# Patient Record
Sex: Male | Born: 1945 | Race: Black or African American | Hispanic: No | Smoking: Never smoker
Health system: Southern US, Community
[De-identification: ages and names within clinical notes are randomized; demographics above are authoritative.]

## PROBLEM LIST (undated history)

## (undated) DIAGNOSIS — R188 Other ascites: Secondary | ICD-10-CM

## (undated) DIAGNOSIS — R160 Hepatomegaly, not elsewhere classified: Secondary | ICD-10-CM

## (undated) DIAGNOSIS — K7460 Unspecified cirrhosis of liver: Secondary | ICD-10-CM

## (undated) DIAGNOSIS — I2510 Atherosclerotic heart disease of native coronary artery without angina pectoris: Secondary | ICD-10-CM

## (undated) DIAGNOSIS — N189 Chronic kidney disease, unspecified: Secondary | ICD-10-CM

## (undated) DIAGNOSIS — I10 Essential (primary) hypertension: Secondary | ICD-10-CM

## (undated) DIAGNOSIS — N529 Male erectile dysfunction, unspecified: Secondary | ICD-10-CM

## (undated) DIAGNOSIS — K635 Polyp of colon: Secondary | ICD-10-CM

## (undated) DIAGNOSIS — K219 Gastro-esophageal reflux disease without esophagitis: Secondary | ICD-10-CM

## (undated) HISTORY — PX: PARACENTESIS: SHX844

## (undated) HISTORY — DX: Chronic kidney disease, unspecified: N18.9

## (undated) HISTORY — DX: Male erectile dysfunction, unspecified: N52.9

## (undated) HISTORY — DX: Polyp of colon: K63.5

## (undated) HISTORY — DX: Unspecified cirrhosis of liver: K74.60

## (undated) HISTORY — DX: Essential (primary) hypertension: I10

## (undated) HISTORY — DX: Atherosclerotic heart disease of native coronary artery without angina pectoris: I25.10

---

## 2000-02-25 HISTORY — PX: CORONARY ANGIOPLASTY WITH STENT PLACEMENT: SHX49

## 2000-02-27 ENCOUNTER — Inpatient Hospital Stay (HOSPITAL_COMMUNITY): Admission: AD | Admit: 2000-02-27 | Discharge: 2000-02-29 | Payer: Self-pay | Admitting: Cardiology

## 2007-10-26 HISTORY — PX: COLONOSCOPY: SHX174

## 2007-11-12 ENCOUNTER — Ambulatory Visit (HOSPITAL_COMMUNITY): Admission: RE | Admit: 2007-11-12 | Discharge: 2007-11-12 | Payer: Self-pay | Admitting: Gastroenterology

## 2007-11-12 ENCOUNTER — Encounter (INDEPENDENT_AMBULATORY_CARE_PROVIDER_SITE_OTHER): Payer: Self-pay | Admitting: Gastroenterology

## 2009-12-13 ENCOUNTER — Ambulatory Visit: Payer: Self-pay | Admitting: Oncology

## 2010-08-09 NOTE — Op Note (Signed)
NAME:  Michael Bowers, Michael Bowers               ACCOUNT NO.:  [id number]   MEDICAL RECORD NO.:  [id number]          PATIENT TYPE:  AMB   LOCATION:  ENDO                         FACILITY:  [facility]   PHYSICIAN:  John C. Madilyn Fireman, M.D.    DATE OF BIRTH:  10-07-1945   DATE OF PROCEDURE:  11/12/2007  DATE OF DISCHARGE:                               OPERATIVE REPORT   INDICATIONS FOR PROCEDURE:  Heme-positive stools in a 65 year old  patient with no prior colon screening.   PROCEDURE:  The patient was placed in the left lateral decubitus  position and placed on the pulse monitor with continuous low-flow oxygen  delivered by nasal cannula.  He was sedated 100 mcg of IV fentanyl and  10 mg IV Versed.  The Pentax video colonoscope was inserted into the  rectum and advanced to the cecum, confirmed by transillumination of  McBurney's point and visualization of the ileocecal valve and  appendiceal orifice.  The prep was good.  The cecum and ascending colon  appeared normal with no masses, polyps, diverticula or other mucosal  abnormalities.  In the transverse colon there was a 1.2-cm pedunculated  polyp which was removed in one piece by snare and retrieved through the  endoscope channel.  The remainder of the transverse, descending sigmoid  and rectum appeared normal with no further polyps, masses, diverticula  or other mucosal abnormalities.  The scope was then withdrawn and the  patient returned to the recovery room in stable condition.  He tolerated  the procedure well and there were no immediate complications.   IMPRESSION:  A single moderate size colon polyp of the transverse colon.   PLAN:  Await histology.           ______________________________  Everardo All Madilyn Fireman, M.D.     [facility]/MEDQ  D:  11/12/2007  T:  11/12/2007  Job:  [id number]   cc:   Ernestina Penna, M.D.  Fax: [phone]

## 2010-08-12 ENCOUNTER — Emergency Department (HOSPITAL_COMMUNITY)
Admission: EM | Admit: 2010-08-12 | Discharge: 2010-08-12 | Disposition: A | Discharge status: Home / Self Care | Payer: Medicare Other | Attending: Emergency Medicine | Admitting: Emergency Medicine

## 2010-08-12 DIAGNOSIS — K625 Hemorrhage of anus and rectum: Secondary | ICD-10-CM | POA: Insufficient documentation

## 2010-08-12 DIAGNOSIS — I10 Essential (primary) hypertension: Secondary | ICD-10-CM | POA: Insufficient documentation

## 2010-08-12 DIAGNOSIS — K644 Residual hemorrhoidal skin tags: Secondary | ICD-10-CM | POA: Insufficient documentation

## 2010-08-12 LAB — CBC
MCH: 29.9 pg (ref 26.0–34.0)
MCHC: 32.4 g/dL (ref 30.0–36.0)
Platelets: 212 10*3/uL (ref 150–400)
RBC: 4.08 MIL/uL — ABNORMAL LOW (ref 4.22–5.81)
RDW: 12.4 % (ref 11.5–15.5)

## 2010-08-12 NOTE — Cardiovascular Report (Signed)
[place]. [facility]  Patient:    Michael Bowers, Michael Bowers                        MRN: [id number] Proc. Date: 02/28/00 Adm. Date:  [id number] Attending:  Lenoria Farrier CC:         Noralyn Pick. Eden Emms, M.D. [facility]  Cath Lab  Monica Becton, M.D.   Cardiac Catheterization  PROCEDURE: 1. Left heart catheterization with coronary angiography and left    ventriculography. 2. PTCA with stent placement in the distal left circumflex.  INDICATIONS:  Mr. Spraggins is a 65 year old male who presented with progressive unstable angina.  He had on presentation an elevated troponin I level consistent with a small non-Q wave myocardial infarction.  He was enrolled in the A to Z study and has been treated with Aggrastat and Lovenox.  DESCRIPTION OF PROCEDURE:  A 6 French sheath was placed in the right femoral artery.  Standard Judkins 6 French catheters were utilized.  Contrast was Omnipaque.  Left ventriculography was performed in both the RAO and LAO projections.  There were no complications.  RESULTS:  HEMODYNAMICS:  Left ventricular pressure 146/12, aortic pressure 156/94. There was no aortic valve gradient.  LEFT VENTRICULOGRAM:  There is moderate akinesis of the inferior wall, moderate akinesis of the posterolateral wall.  Ejection fraction calculated at 55%.  Trace mitral regurgitation.  CORONARY ARTERIOGRAPHY:  (Right dominant).  Left main is normal.  Left anterior descending artery has a 40% followed by a 60% stenosis in the midvessel and a 30% stenosis in the distal vessel.  There was a large first diagonal branch which has a 20% stenosis proximally and a 60% stenosis in the midvessel.  There is a small second diagonal.  Left circumflex is a large vessel which comes off the left main at a very acute angle.  There is a 20% stenosis in the mid circumflex.  The distal circumflex has a 30% followed by a 95% stenosis.  The circumflex gives rise to a normal size  OM1, small OM2, and a large branching OM3.  The OM3 has a 30% stenosis in the proximal portion and a 70% stenosis in a small branch.  Right coronary artery has a 30% stenosis in the proximal vessel and 30% stenosis in the midvessel.  The distal right coronary artery gives rise to a large posterior descending artery and three small posterolateral branches.  IMPRESSION: 1. Left ventricular systolic function at lower limits of normal with wall    motion abnormalities as described. 2. Two vessel coronary artery disease as described with moderate, but    nonobstructive disease in the left anterior descending artery and critical    disease in the distal left circumflex.  PLAN:  Percutaneous intervention of the left circumflex.  See below.  PTCA PROCEDURE:  Following completion of the diagnostic catheterization, we proceeded directly with coronary intervention.  Aggrastat was continued throughout the procedure.  Heparin was administered per protocol.  We used a 7 [place] photoleft guiding catheter.  We initially attempted to pass a BMW wire into the left circumflex, however, due to the severe angulation and origin of the circumflex off of the left main, we could not manipulate this wire into the circumflex.  We then were able to successfully advance a Traverse wire into the circumflex and this was advanced beyond the lesion and positioned in the distal third obtuse marginal branch.  The lesion was then dilated with a 3.0  x 15 mm Maverick balloon inflated to 10 atmospheres.  We then deployed a 3.0 x 12 mm Nir stent at a deployment pressure of 12 atmospheres.  Final angiographic images revealed an excellent result with 0% residual stenosis and Timi 3 flow.  COMPLICATIONS:  None.  RESULTS:  Successful PTCA with stent placement in the distal left circumflex reducing a 95% stenosis to 0% residual with Timi 3 flow.  PLAN:  Aggrastat will be continued for an additional 18 hours.  Plavix will  be administered for four weeks. DD:  02/28/00 TD:  02/28/00 Job: [id number]

## 2010-08-12 NOTE — Discharge Summary (Signed)
[place]. [facility]  Patient:    Michael Bowers, Michael Bowers                        MRN: [id number] Adm. Date:  [id number] Disc. Date: 02/29/00 Attending:  Lenoria Farrier Dictator:   Lavella Hammock, P.A.                  Referring Physician Discharge [facility]  DATE OF BIRTH:  1945/09/05  PROCEDURES: 1. Cardiac catheterization. 2. Coronary arteriogram. 3. Left ventriculogram. 4. PTCA and stent of one vessel.  HOSPITAL COURSE:  Mr. Loadholt is a 65 year old male with no known history of coronary artery disease, who was admitted on February 27, 2000 for chest pain that was described as a left-sided chest burning with exertion and relieved by rest.  He had pain for about 20 minutes on the day of admission and came to the emergency room at [facility].  His chest pain was relieved by aspirin, as well as nitroglycerin and labetalol 20 mg IV for a blood pressure of 220/125. His troponin was increased slightly from ______ to 0.21, and it was decided that a cardiac catheterization was the best option for him.  He had a catheterization on February 28, 2000, which showed a normal left main, an LAD with a 40%, and then a 60%, and then a 30% lesion.  The first diagonal had a 20% and then a 60% lesion.  The circumflex had a 20%, and then a 30%, and then a 95% lesion.  There was a distal 30% lesion, and a 70% lesion in the OM-3.  He had PTCA and stent of the circumflex, reducing the stenosis from 95% to 0 with TIMI 3 flow.  His EF was 55% with moderate inferior akinesis and moderate posterolateral akinesis.  He tolerated the procedure well and the sheath was removed without difficulty.  The next day, he was pain-free and had no problems with his groin.  He is being seen by the [facility] for his cholesterol, which showed an HDL 32 and an LDL of 121.  He was enrolled in the A-Z study.  He was also to be seen by cardiac rehab.  If his groin is stable with ambulation  and he has no chest pain or shortness of breath, he is to be discharged on February 29, 2000.  LABORATORY VALUES:  Sodium 133, potassium 3.7, chloride 103, CO2 26, BUN 10, creatinine 1.2, glucose 109.  Post procedure CK-MB negative.  Hemoglobin 12.7, hematocrit 37.2, wbcs 8.2, platelets 288.  Total cholesterol 170, triglycerides 85, HDL 32, LDL 121.  DISCHARGE CONDITION:  Improved  CONSULTS:  None.  COMPLICATIONS:  None.  DISCHARGE DIAGNOSES: 1. Coronary artery disease, status post percutaneous transluminal coronary    angioplasty and stent to the circumflex this admission, with residual    disease in the left anterior descending artery and first diagonal of 50%,    and in the third obtuse marginal at 70%. 2. Preserved left ventricular function with an ejection fraction of 55%. 3. Hypertension. 4. Hyperlipidemia. 5. Family history of premature coronary artery disease.  DISCHARGE INSTRUCTIONS:  ACTIVITY:  His activity level is to include no driving, strenuous, or sexual activity for two days.  WOUND CARE:  He is to call the office for bleeding, swelling, or drainage at the catheterization site.  DIET:  He is to stick to a low fat diet.  FOLLOW-UP:  He is to see Joellyn Rued,  P.A.C. on December 20 at 10 a.m. for Dr. Gerri Spore.  He is to follow up with Dr. Dewaine Conger and obtain an appointment with him.  DISCHARGE MEDICATIONS: 1. Coated aspirin 325 mg q.d. 2. DD:  02/29/00 TD:  02/29/00 Job: [id number]

## 2010-08-17 ENCOUNTER — Encounter: Payer: Self-pay | Admitting: Gastroenterology

## 2010-08-17 ENCOUNTER — Ambulatory Visit (INDEPENDENT_AMBULATORY_CARE_PROVIDER_SITE_OTHER): Payer: Medicare Other | Admitting: Gastroenterology

## 2010-08-17 VITALS — BP 117/70 | HR 90 | Temp 98.4°F | Ht 67.0 in | Wt 170.0 lb

## 2010-08-17 DIAGNOSIS — D649 Anemia, unspecified: Secondary | ICD-10-CM

## 2010-08-17 DIAGNOSIS — K629 Disease of anus and rectum, unspecified: Secondary | ICD-10-CM

## 2010-08-17 DIAGNOSIS — K6289 Other specified diseases of anus and rectum: Secondary | ICD-10-CM

## 2010-08-17 DIAGNOSIS — K5900 Constipation, unspecified: Secondary | ICD-10-CM

## 2010-08-17 DIAGNOSIS — R195 Other fecal abnormalities: Secondary | ICD-10-CM

## 2010-08-17 DIAGNOSIS — K5909 Other constipation: Secondary | ICD-10-CM | POA: Insufficient documentation

## 2010-08-17 DIAGNOSIS — K625 Hemorrhage of anus and rectum: Secondary | ICD-10-CM

## 2010-08-17 MED ORDER — POLYETHYLENE GLYCOL 3350 POWD: 17.0000 g | Freq: Every day | Status: DC | PRN

## 2010-08-17 NOTE — Assessment & Plan Note (Signed)
Rectal bleeding. Mostly on the toilet tissue. He describes small volume which is intermittent. He has perianal lesions with evidence of irritation and recent bleeding, ? genital warts. Stool is Hemoccult-positive on exam. He has mild anemia. Last colonoscopy not quite 3 years ago. Suspect rectal bleeding due to anorectal source. The amount describes not explain his anemia however. I offered him a colonoscopy but he declined at this point. Would recommend blood work. Will start a bowel regimen consisting of MiraLax 17 g daily. He can discontinue the Colace. Him come back in 2 months for further evaluation. He will call sooner if worsening symptoms. We'll discuss perianal lesions with Dr. Darrick Penna, anticipate dermatology evaluation.

## 2010-08-17 NOTE — Assessment & Plan Note (Signed)
Refer to rectal bleeding assessment and plan.

## 2010-08-17 NOTE — Assessment & Plan Note (Signed)
Refer to rectal bleeding assessment and plan. 

## 2010-08-17 NOTE — Progress Notes (Signed)
Primary Care Physician:  Dolores Hoose, OTR  Referring MD: Zadie Rhine, [facility] ED  Primary Gastroenterologist:  Jonette Eva, MD  Chief Complaint  Patient presents with  . Rectal Bleeding    HPI:  Michael Bowers is a 65 y.o. male here for further evaluation rectal bleeding and rectal pain. He complains of intermittent toilet tissue hematochezia. He has intermittent constipation. Some days may go 3 days without bowel movement before he takes something. Recently had severe constipation went to ED. Rectal exam at that time showed normal stool color. External hemorrhoids. His hemoglobin was 12.2, hematocrit 37.7, MCV 92.4. He had a colonoscopy in August 2009 by Dr. Dorena Cookey which showed inflammatory polyp but otherwise unremarkable. He started Colace daily. He took Dulcolax yesterday. He is having 2-3 bowel movements per day, soft stool. Last episode of toilet tissue hematochezia was yesterday. He stopped taking aspirin last week when he began bleeding. He complains of rectal pain. It hurts when he sits.   Current Outpatient Prescriptions  Medication Sig Dispense Refill  . Docusate Sodium (COLACE PO) Take 5 mg by mouth.        . hydrocortisone (ANUSOL-HC) 2.5 % rectal cream Place rectally 2 (two) times daily.        Marland Kitchen lisinopril (PRINIVIL,ZESTRIL) 20 MG tablet Take 20 mg by mouth daily.        . Olmesartan-Amlodipine-HCTZ (TRIBENZOR) 40-10-25 MG TABS Take by mouth.        . Polyethylene Glycol 3350 POWD Take 17 g by mouth daily as needed.  527 g  5    Allergies as of 08/17/2010  . (No Known Allergies)    Past Medical History  Diagnosis Date  . HTN (hypertension)   . CAD (coronary artery disease)     angioplasty    Past Surgical History  Procedure Date  . Angioplasty        . Colonoscopy 10/2007    Dr. Madilyn Fireman, inflammatory polyp    Family History  Problem Relation Age of Onset  . Colon cancer Neg Hx   . Colon polyps Neg Hx   . Liver disease Neg Hx     History     Social History  . Marital Status: Widowed    Spouse Name: N/A    Number of Children: 5  . Years of Education: N/A   Occupational History  . retired    Social History Main Topics  . Smoking status: Never Smoker   . Smokeless tobacco: Not on file  . Alcohol Use: No  . Drug Use: No  . Sexually Active: Not on file      ROS:  General: Negative for anorexia, weight loss, fever, chills, fatigue, weakness. Eyes: Negative for vision changes.  ENT: Negative for hoarseness, difficulty swallowing , nasal congestion. CV: Negative for chest pain, angina, palpitations, dyspnea on exertion, peripheral edema.  Respiratory: Negative for dyspnea at rest, dyspnea on exertion, cough, sputum, wheezing.  GI: See history of present illness. Negative for heartburn, dysphagia, abdominal pain, melena, vomiting. GU:  Negative for dysuria, hematuria, urinary incontinence, urinary frequency, nocturnal urination.  MS: Negative for joint pain, low back pain.  Derm: Negative for rash or itching.  Neuro: Negative for weakness, abnormal sensation, seizure, frequent headaches, memory loss, confusion.  Psych: Negative for anxiety, depression, suicidal ideation, hallucinations.  Endo: Negative for unusual weight change.  Heme: Negative for bruising or bleeding. Allergy: Negative for rash or hives.    Physical Examination:  BP 117/70  Pulse 90  Temp(Src) 98.4 F (36.9 C) (Temporal)  Ht 5\' 7"  (1.702 m)  Wt 170 lb (77.111 kg)  BMI 26.63 kg/m2   General: Well-nourished, well-developed in no acute distress.  Head: Normocephalic, atraumatic.   Eyes: Conjunctiva pink, no icterus. Mouth: Oropharyngeal mucosa moist and pink , no lesions erythema or exudate. Neck: Supple without thyromegaly, masses, or lymphadenopathy.  Lungs: Clear to auscultation bilaterally.  Heart: Regular rate and rhythm, no murmurs rubs or gallops.  Abdomen: Bowel sounds are normal, nontender, nondistended, no hepatosplenomegaly or  masses, no abdominal bruits or    hernia , no rebound or guarding.   DRE: External exam shows multiple lesion with excoriations perianally, condyloma-like. No masses in rectal vault. Brown stool, heme positive. Extremities: No lower extremity edema.  Neuro: Alert and oriented x 4 , grossly normal neurologically.  Skin: Warm and dry, no rash or jaundice.   Psych: Alert and cooperative, normal mood and affect.

## 2010-08-17 NOTE — Progress Notes (Signed)
Cc to PCP 

## 2010-08-18 LAB — IRON AND TIBC: %SAT: 31 % (ref 20–55)

## 2010-08-18 LAB — CBC WITH DIFFERENTIAL/PLATELET
Basophils Absolute: 0 10*3/uL (ref 0.0–0.1)
Lymphocytes Relative: 45 % (ref 12–46)
Neutro Abs: 1 10*3/uL — ABNORMAL LOW (ref 1.7–7.7)
Neutrophils Relative %: 23 % — ABNORMAL LOW (ref 43–77)
Platelets: 248 10*3/uL (ref 150–400)
RDW: 13.2 % (ref 11.5–15.5)
WBC: 4.4 10*3/uL (ref 4.0–10.5)

## 2010-08-23 ENCOUNTER — Other Ambulatory Visit: Payer: Self-pay

## 2010-08-23 DIAGNOSIS — D649 Anemia, unspecified: Secondary | ICD-10-CM

## 2010-08-23 NOTE — Progress Notes (Signed)
Agree with HFP and Dermatology referral.

## 2010-08-23 NOTE — Progress Notes (Signed)
Was unable to order in Epic. Was told by Deloris Ping with IT for [facility] to do a paper order.

## 2010-08-25 ENCOUNTER — Other Ambulatory Visit: Payer: Self-pay | Admitting: Gastroenterology

## 2010-08-25 DIAGNOSIS — R7989 Other specified abnormal findings of blood chemistry: Secondary | ICD-10-CM

## 2010-08-25 NOTE — Progress Notes (Signed)
Please send patient to dermatologist for rectal lesions. Discussed with Dr. Darrick Penna. Recommend since he is not have colonoscopy. ?warts of perianal area?

## 2010-08-26 NOTE — Progress Notes (Signed)
Pt referred to Drs Hall/McConnell- he has an appt on 06/14- I left a mess for pt to call me back to go over details.

## 2010-08-29 NOTE — Progress Notes (Signed)
Routing to [employer], not sure if she knew this.

## 2010-08-30 ENCOUNTER — Other Ambulatory Visit: Payer: Self-pay | Admitting: Gastroenterology

## 2010-08-30 LAB — HEPATIC FUNCTION PANEL
Alkaline Phosphatase: 61 U/L (ref 39–117)
Bilirubin, Direct: 0.1 mg/dL (ref 0.0–0.3)
Indirect Bilirubin: 0.4 mg/dL (ref 0.0–0.9)
Total Protein: 8.1 g/dL (ref 6.0–8.3)

## 2010-08-31 LAB — CBC WITH DIFFERENTIAL/PLATELET
Eosinophils Absolute: 0 10*3/uL (ref 0.0–0.7)
Hemoglobin: 12.3 g/dL — ABNORMAL LOW (ref 13.0–17.0)
Lymphocytes Relative: 48 % — ABNORMAL HIGH (ref 12–46)
Lymphs Abs: 2.2 10*3/uL (ref 0.7–4.0)
MCH: 29.9 pg (ref 26.0–34.0)
MCV: 94.4 fL (ref 78.0–100.0)
Monocytes Relative: 26 % — ABNORMAL HIGH (ref 3–12)
Neutrophils Relative %: 25 % — ABNORMAL LOW (ref 43–77)
RBC: 4.12 MIL/uL — ABNORMAL LOW (ref 4.22–5.81)
WBC: 4.7 10*3/uL (ref 4.0–10.5)

## 2010-08-31 LAB — PATHOLOGIST SMEAR REVIEW

## 2010-09-09 ENCOUNTER — Telehealth: Payer: Self-pay | Admitting: Gastroenterology

## 2010-09-09 NOTE — Telephone Encounter (Signed)
LFTs normal. Hold on abd u/s for now.

## 2010-09-09 NOTE — Telephone Encounter (Signed)
Message copied by Tiffany Kocher on Fri Sep 09, 2010 10:51 AM ------      Message from: Jonette Eva L      Created: Mon Aug 29, 2010  1:55 PM       Wait on U/S until HFP results known.                  ----- Message -----         From: Trudee Kuster, LPN         Sent: 08/29/2010  10:38 AM           To: Arlyce Harman, MD, Suzan Nailer said we would need to ask Dr. Darrick Penna      ----- Message -----         From: Avie Arenas         Sent: 08/25/2010   8:07 AM           To: Tana Coast, PA, Arlyce Harman, MD, #            Medicare will not cover the [place] using the dx elevated ferritin ( abnormal chemistry levels), any suggestion on what else we can use?

## 2010-09-12 ENCOUNTER — Other Ambulatory Visit: Payer: Self-pay

## 2010-09-12 DIAGNOSIS — D649 Anemia, unspecified: Secondary | ICD-10-CM

## 2010-10-18 ENCOUNTER — Ambulatory Visit: Payer: Medicare Other | Admitting: Gastroenterology

## 2010-10-18 ENCOUNTER — Ambulatory Visit (INDEPENDENT_AMBULATORY_CARE_PROVIDER_SITE_OTHER): Payer: Medicare Other | Admitting: Gastroenterology

## 2010-10-18 ENCOUNTER — Encounter: Payer: Self-pay | Admitting: Gastroenterology

## 2010-10-18 DIAGNOSIS — D649 Anemia, unspecified: Secondary | ICD-10-CM

## 2010-10-18 DIAGNOSIS — K625 Hemorrhage of anus and rectum: Secondary | ICD-10-CM

## 2010-10-18 MED ORDER — OMEPRAZOLE 20 MG PO CPDR
20.0000 mg | DELAYED_RELEASE_CAPSULE | Freq: Every day | ORAL | Status: DC
Start: 2010-10-18 — End: 2012-01-31

## 2010-10-18 NOTE — Assessment & Plan Note (Signed)
Explained to patient and his daughter that his blood count is 2 points below normal and has been since 2001. He had a recent TCS but no EGD. Explained the recommendation, benefits, and risks. Pt not interested in additional labs or procedures.  Add Prilosec. Pt not sure of he will pick up Rx. OPV prn. Pt did not desire to follow up in our office.

## 2010-10-18 NOTE — Progress Notes (Signed)
  Subjective:    Patient ID: Michael Bowers, male    DOB: [birth year], 65 y.o.   MRN: [id number]  PCP: Paulita Cradle, [place]  HPI nO RECTAL BLEEDING since MAY 2012. NO BLACK TARRY STOOLS. Uses 325 mg ASA daily. No nausea, vomiting, problems swallowing, abd pain, weight loss. Bms: 2/soft. No straining. TCS 2009-screening?. No Fam Hx colon CA or polyps. No low blood 3 years ago.   Past Medical History  Diagnosis Date  . HTN (hypertension)   . CAD (coronary artery disease)     angioplasty   Past Surgical History  Procedure Date  . Angioplasty        . Colonoscopy 10/2007    Dr. Madilyn Fireman, inflammatory polyp   No Known Allergies  Current Outpatient Prescriptions  Medication Sig Dispense Refill  . Olmesartan-Amlodipine-HCTZ 40-5-25 MG TABS Take by mouth.        Tery Sanfilippo Sodium (COLACE PO) Take 5 mg by mouth.        Marland Kitchen lisinopril (PRINIVIL,ZESTRIL) 20 MG tablet Take 20 mg by mouth daily.        .      . Polyethylene Glycol 3350 POWD Take 17 g by mouth daily as needed.  527 g  5      Review of Systems  All other systems reviewed and are negative.   LABS REVIEWED: HB 12.7 IN 2011. TCS 2009 PERFORMED FOR HEME POS STOOL.    Objective:   Physical Exam  Vitals reviewed. Constitutional: He is oriented to person, place, and time. He appears well-developed and well-nourished. No distress.  HENT:  Head: Normocephalic and atraumatic.  Neck: Normal range of motion.  Cardiovascular: Normal rate, regular rhythm and normal heart sounds.   Pulmonary/Chest: Effort normal and breath sounds normal.  Abdominal: Soft. Bowel sounds are normal. He exhibits no distension. There is no tenderness.  Neurological: He is alert and oriented to person, place, and time.       NO FOCAL DEFICITS          Assessment & Plan:

## 2010-10-18 NOTE — Assessment & Plan Note (Addendum)
RESOLVED w/ ANUSOL & likely 2o to internal hemorrhoids.

## 2010-10-19 NOTE — Progress Notes (Signed)
Cc to PCP 

## 2011-10-18 NOTE — Progress Notes (Signed)
Per LL disregard this.

## 2012-01-03 ENCOUNTER — Encounter: Payer: Self-pay | Admitting: Cardiology

## 2012-01-03 ENCOUNTER — Ambulatory Visit (INDEPENDENT_AMBULATORY_CARE_PROVIDER_SITE_OTHER): Payer: Medicare Other | Admitting: Cardiology

## 2012-01-03 VITALS — BP 160/110 | HR 68 | Ht 67.0 in | Wt 184.0 lb

## 2012-01-03 DIAGNOSIS — I25810 Atherosclerosis of coronary artery bypass graft(s) without angina pectoris: Secondary | ICD-10-CM | POA: Insufficient documentation

## 2012-01-03 MED ORDER — METOPROLOL SUCCINATE ER 50 MG PO TB24: 50.0000 mg | ORAL_TABLET | Freq: Every day | ORAL | Status: DC

## 2012-01-03 NOTE — Progress Notes (Signed)
HPI The patient presents for followup of known coronary disease. He hasn't been seen back here on an. He had PCI of the circumflex lesion in 2001 with nonobstructive disease as described below. At that time he had chest pain. Since then he denies any of this. 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 He has little garden on this.  The patient denies any new symptoms such as chest discomfort, neck or arm discomfort. There has been no new shortness of breath, PND or orthopnea. There have been no reported palpitations, presyncope or syncope.  No Known Allergies  Current Outpatient Prescriptions  Medication Sig Dispense Refill  . amLODipine (NORVASC) 10 MG tablet       . aspirin 81 MG tablet Take 81 mg by mouth daily.      Marland Kitchen atorvastatin (LIPITOR) 10 MG tablet       . BENICAR 40 MG tablet       . hydrochlorothiazide (HYDRODIURIL) 25 MG tablet       . lisinopril (PRINIVIL,ZESTRIL) 10 MG tablet Take 10 mg by mouth daily.      Marland Kitchen omeprazole (PRILOSEC) 20 MG capsule Take 1 capsule (20 mg total) by mouth daily.  30 capsule  11    Past Medical History  Diagnosis Date  . HTN (hypertension)   . CAD (coronary artery disease)     angioplasty    Past Surgical History  Procedure Date  . Angioplasty        . Colonoscopy 10/2007    Dr. Madilyn Fireman, inflammatory polyp    Family History  Problem Relation Age of Onset  . Colon cancer Neg Hx   . Colon polyps Neg Hx   . Liver disease Neg Hx     History   Social History  . Marital Status: Widowed    Spouse Name: N/A    Number of Children: 5  . Years of Education: N/A   Occupational History  . retired    Social History Main Topics  . Smoking status: Never Smoker   . Smokeless tobacco: Not on file  . Alcohol Use: No  . Drug Use: No  . Sexually Active: Not on file   Other Topics Concern  . Not on file   Social History Narrative  . No narrative on file    ROS:  As stated in the HPI and negative for all other systems.   PHYSICAL  EXAM BP 160/110  Pulse 68  Ht 5\' 7"  (1.702 m)  Wt 83.462 kg (184 lb)  BMI 28.82 kg/m2 GENERAL:  Well appearing HEENT:  Pupils equal round and reactive, fundi not visualized, oral mucosa unremarkable NECK:  No jugular venous distention, waveform within normal limits, carotid upstroke brisk and symmetric, no bruits, no thyromegaly LYMPHATICS:  No cervical, inguinal adenopathy LUNGS:  Clear to auscultation bilaterally BACK:  No CVA tenderness CHEST:  Unremarkable HEART:  PMI not displaced or sustained,S1 and S2 within normal limits, no S3, no S4, no clicks, no rubs, no murmurs ABD:  Flat, positive bowel sounds normal in frequency in pitch, no bruits, no rebound, no guarding, no midline pulsatile mass, no hepatomegaly, no splenomegaly EXT:  2 plus pulses throughout, no edema, no cyanosis no clubbing SKIN:  No rashes no nodules NEURO:  Cranial nerves II through XII grossly intact, motor grossly intact throughout PSYCH:  Cognitively intact, oriented to person place and time  EKG:  Sinus rhythm, rate 68, axis within normal limits, intervals within normal limits, no acute ST-T  wave changes, LVH by voltage criteria.  ASSESSMENT AND PLAN  CAD - I would like to screen him with an exercise treadmill test at some point when his blood pressure better controlled.  HYPERTENSION - And did not want him to be on both an ACE inhibitor and an ARB. I will stop the lisinopril. He used to be on metoprolol 200 mg. I don't see a contraindication to this he doesn't recall why this. I will restart Toprol-XL 50 mg daily. He will get a blood pressure cuff and we will titrate.  CKD - This is almost definitely related to his hypertension. This will be followed by his primary provider.  DYSLIPIDEMIA - The goal LDL is less than 100 and HDL greater than 40. He was at its target in June. No change in therapy is indicated.

## 2012-01-03 NOTE — Patient Instructions (Addendum)
Please stop your Lisinopril. Start Toprol 50 mg a day Continue all other medications as listed  Please obtain a blood pressure monitor to use at home.  See Dr Antoine Poche in 1 month

## 2012-01-31 ENCOUNTER — Encounter: Payer: Self-pay | Admitting: Cardiology

## 2012-01-31 ENCOUNTER — Ambulatory Visit (INDEPENDENT_AMBULATORY_CARE_PROVIDER_SITE_OTHER): Payer: Medicare Other | Admitting: Cardiology

## 2012-01-31 VITALS — BP 160/90 | HR 74 | Ht 67.0 in | Wt 183.0 lb

## 2012-01-31 DIAGNOSIS — I25810 Atherosclerosis of coronary artery bypass graft(s) without angina pectoris: Secondary | ICD-10-CM

## 2012-01-31 MED ORDER — METOPROLOL SUCCINATE ER 100 MG PO TB24: 100.0000 mg | ORAL_TABLET | Freq: Every day | ORAL | Status: DC

## 2012-01-31 NOTE — Progress Notes (Signed)
HPI The patient presents for followup of HTN.  He has a history of CAD with a PCI of the circumflex lesion in 2001 with nonobstructive disease as described below.  At the last visit I modified his blood pressure medications restarting a beta blocker and getting rid of his ACE inhibitor. He tolerated this well. He's had no presyncope or syncope. He's had no chest pressure, neck or arm discomfort. He denies any shortness of breath. He did get a blood pressure cuff but was not operating rectally.  No Known Allergies  Current Outpatient Prescriptions  Medication Sig Dispense Refill  . amLODipine (NORVASC) 10 MG tablet       . aspirin 81 MG tablet Take 81 mg by mouth daily.      Marland Kitchen atorvastatin (LIPITOR) 10 MG tablet       . BENICAR 40 MG tablet       . hydrochlorothiazide (HYDRODIURIL) 25 MG tablet       . metoprolol succinate (TOPROL-XL) 50 MG 24 hr tablet Take 1 tablet (50 mg total) by mouth daily. Take with or immediately following a meal.  30 tablet  11    Past Medical History  Diagnosis Date  . HTN (hypertension)   . CAD (coronary artery disease)     angioplasty  . CKD (chronic kidney disease)   . ED (erectile dysfunction)     Past Surgical History  Procedure Date  . Angioplasty        . Colonoscopy 10/2007    Dr. Madilyn Fireman, inflammatory polyp    Family History  Problem Relation Age of Onset  . Colon cancer Neg Hx   . Colon polyps Neg Hx   . Liver disease Neg Hx     History   Social History  . Marital Status: Widowed    Spouse Name: N/A    Number of Children: 5  . Years of Education: N/A   Occupational History  . retired    Social History Main Topics  . Smoking status: Never Smoker   . Smokeless tobacco: Not on file  . Alcohol Use: No  . Drug Use: No  . Sexually Active: Not on file   Other Topics Concern  . Not on file   Social History Narrative   Divorced and lives with girlfriend.    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      ROS:  As stated in the  HPI and negative for all other systems.   PHYSICAL EXAM BP 160/90  Pulse 74  Ht 5\' 7"  (1.702 m)  Wt 183 lb (83.008 kg)  BMI 28.66 kg/m2 GENERAL:  Well appearing HEENT:  Pupils equal round and reactive, fundi not visualized, oral mucosa unremarkable NECK:  No jugular venous distention, waveform within normal limits, carotid upstroke brisk and symmetric, no bruits, no thyromegaly LYMPHATICS:  No cervical, inguinal adenopathy LUNGS:  Clear to auscultation bilaterally BACK:  No CVA tenderness CHEST:  Unremarkable HEART:  PMI not displaced or sustained,S1 and S2 within normal limits, no S3, no S4, no clicks, no rubs, no murmurs ABD:  Flat, positive bowel sounds normal in frequency in pitch, no bruits, no rebound, no guarding, no midline pulsatile mass, no hepatomegaly, no splenomegaly EXT:  2 plus pulses throughout, no edema, no cyanosis no clubbing SKIN:  No rashes no nodules NEURO:  Cranial nerves II through XII grossly intact, motor grossly intact throughout PSYCH:  Cognitively intact, oriented to person place and time  EKG:  Sinus rhythm, rate  68, axis within normal limits, intervals within normal limits, no acute ST-T wave changes, LVH by voltage criteria.  ASSESSMENT AND PLAN  CAD - The patient has no new sypmtoms.  No further cardiovascular testing is indicated.  We will continue with aggressive risk reduction and meds as listed.  HYPERTENSION - We checked to make sure that his cuff is working properly. I will increase his metoprolol to 100 mg daily and he will keep his blood pressure diary. I will continue to titrate meds as needed.  CKD - This is almost definitely related to his hypertension. This will be followed by his primary provider.

## 2012-01-31 NOTE — Patient Instructions (Addendum)
Please increase your Toprol to 100 mg a day Continue all other medications as listed  Follow up in 1 month with Dr Antoine Poche

## 2012-02-28 ENCOUNTER — Ambulatory Visit (INDEPENDENT_AMBULATORY_CARE_PROVIDER_SITE_OTHER): Payer: Medicare Other | Admitting: Cardiology

## 2012-02-28 ENCOUNTER — Encounter: Payer: Self-pay | Admitting: Cardiology

## 2012-02-28 VITALS — BP 148/84 | HR 70 | Ht 67.0 in | Wt 182.0 lb

## 2012-02-28 DIAGNOSIS — I25810 Atherosclerosis of coronary artery bypass graft(s) without angina pectoris: Secondary | ICD-10-CM

## 2012-02-28 NOTE — Progress Notes (Signed)
    HPI The patient presents for followup of HTN.  At the last visit his blood pressure is elevated and I increased his metoprolol. He was to keep a blood pressure diary. Unfortunately he comes to the appointment only with one reading written a tiny scrap paper from this morning. This was actually normal. He feels well. The patient denies any new symptoms such as chest discomfort, neck or arm discomfort. There has been no new shortness of breath, PND or orthopnea. There have been no reported palpitations, presyncope or syncope.  No Known Allergies  Current Outpatient Prescriptions  Medication Sig Dispense Refill  . amLODipine (NORVASC) 10 MG tablet Take 10 mg by mouth daily.       Marland Kitchen aspirin 81 MG tablet Take 81 mg by mouth daily.      Marland Kitchen atorvastatin (LIPITOR) 10 MG tablet Take 10 mg by mouth daily.       Marland Kitchen BENICAR 40 MG tablet Take 40 mg by mouth.       . hydrochlorothiazide (HYDRODIURIL) 25 MG tablet Take 25 mg by mouth daily.       . metoprolol succinate (TOPROL-XL) 100 MG 24 hr tablet Take 1 tablet (100 mg total) by mouth daily. Take with or immediately following a meal.  30 tablet  11    Past Medical History  Diagnosis Date  . HTN (hypertension)   . CAD (coronary artery disease)     angioplasty  . CKD (chronic kidney disease)   . ED (erectile dysfunction)     Past Surgical History  Procedure Date  . Angioplasty        . Colonoscopy 10/2007    Dr. Madilyn Fireman, inflammatory polyp     ROS:  As stated in the HPI and negative for all other systems.   PHYSICAL EXAM BP 148/84  Pulse 70  Ht 5\' 7"  (1.702 m)  Wt 182 lb (82.555 kg)  BMI 28.51 kg/m2 GENERAL:  Well appearing HEENT:  Pupils equal round and reactive, fundi not visualized, oral mucosa unremarkable NECK:  No jugular venous distention, waveform within normal limits, carotid upstroke brisk and symmetric, no bruits, no thyromegaly LUNGS:  Clear to auscultation bilaterally BACK:  No CVA tenderness CHEST:   Unremarkable HEART:  PMI not displaced or sustained,S1 and S2 within normal limits, no S3, no S4, no clicks, no rubs, no murmurs ABD:  Flat, positive bowel sounds normal in frequency in pitch, no bruits, no rebound, no guarding, no midline pulsatile mass, no hepatomegaly, no splenomegaly EXT:  2 plus pulses throughout, no edema, no cyanosis no clubbing   ASSESSMENT AND PLAN  CAD - The patient has no new sypmtoms.  No further cardiovascular testing is indicated.  We will continue with aggressive risk reduction and meds as listed.  HYPERTENSION - I have very few date of point on which to make decisions. However, at this point I will make no change to the medications. He was instructed again to keep a blood pressure diary.  CKD - This is almost definitely related to his hypertension. This will be followed by his primary provider.

## 2012-02-28 NOTE — Patient Instructions (Addendum)
The current medical regimen is effective;  continue present plan and medications.  Follow up in 6 months with Dr Hochrein.  You will receive a letter in the mail 2 months before you are due.  Please call us when you receive this letter to schedule your follow up appointment.  

## 2012-09-23 ENCOUNTER — Other Ambulatory Visit: Payer: Self-pay

## 2012-09-23 MED ORDER — HYDROCHLOROTHIAZIDE 25 MG PO TABS: 25.0000 mg | ORAL_TABLET | Freq: Every day | ORAL | Status: DC

## 2012-09-23 MED ORDER — AMLODIPINE BESYLATE 10 MG PO TABS: 10.0000 mg | ORAL_TABLET | Freq: Every day | ORAL | Status: DC

## 2012-09-23 MED ORDER — OLMESARTAN MEDOXOMIL 40 MG PO TABS: 40.0000 mg | ORAL_TABLET | Freq: Every day | ORAL | Status: DC

## 2012-09-23 NOTE — Telephone Encounter (Signed)
Last seen 11/21/11   DFS

## 2012-09-23 NOTE — Telephone Encounter (Signed)
Patient must make an appointment to be seen++++++++ We will okay all prescriptions x1 Give him an appointment to see one of the mid level

## 2012-09-24 ENCOUNTER — Telehealth: Payer: Self-pay | Admitting: General Practice

## 2012-09-24 NOTE — Telephone Encounter (Signed)
Left message on home voicemail that patient will need to be seen before additional refills will be given.

## 2012-10-08 ENCOUNTER — Other Ambulatory Visit: Payer: Self-pay | Admitting: Geriatric Medicine

## 2012-10-08 NOTE — Telephone Encounter (Signed)
Per chart meds were refilled for one month

## 2012-10-11 ENCOUNTER — Encounter: Payer: Self-pay | Admitting: General Practice

## 2012-10-11 ENCOUNTER — Ambulatory Visit (INDEPENDENT_AMBULATORY_CARE_PROVIDER_SITE_OTHER): Payer: Medicare Other | Admitting: General Practice

## 2012-10-11 VITALS — BP 158/91 | HR 69 | Temp 98.2°F | Ht 67.0 in | Wt 174.5 lb

## 2012-10-11 DIAGNOSIS — Z09 Encounter for follow-up examination after completed treatment for conditions other than malignant neoplasm: Secondary | ICD-10-CM

## 2012-10-11 DIAGNOSIS — I10 Essential (primary) hypertension: Secondary | ICD-10-CM

## 2012-10-11 LAB — COMPLETE METABOLIC PANEL WITH GFR
Alkaline Phosphatase: 150 U/L — ABNORMAL HIGH (ref 39–117)
BUN: 20 mg/dL (ref 6–23)
GFR, Est Non African American: 33 mL/min — ABNORMAL LOW
Glucose, Bld: 95 mg/dL (ref 70–99)
Sodium: 134 mEq/L — ABNORMAL LOW (ref 135–145)
Total Bilirubin: 0.4 mg/dL (ref 0.3–1.2)

## 2012-10-11 LAB — POCT CBC
Granulocyte percent: 48.6 %G (ref 37–80)
HCT, POC: 41.9 % — AB (ref 43.5–53.7)
Hemoglobin: 14.6 g/dL (ref 14.1–18.1)
MCV: 87.5 fL (ref 80–97)
RDW, POC: 12.2 %
WBC: 4.2 10*3/uL — AB (ref 4.6–10.2)

## 2012-10-11 MED ORDER — AMLODIPINE BESYLATE 10 MG PO TABS: 10.0000 mg | ORAL_TABLET | Freq: Every day | ORAL | Status: DC

## 2012-10-11 MED ORDER — OLMESARTAN MEDOXOMIL 40 MG PO TABS: 40.0000 mg | ORAL_TABLET | Freq: Every day | ORAL | Status: DC

## 2012-10-11 MED ORDER — HYDROCHLOROTHIAZIDE 25 MG PO TABS: 25.0000 mg | ORAL_TABLET | Freq: Every day | ORAL | Status: DC

## 2012-10-11 NOTE — Progress Notes (Signed)
  Subjective:    Patient ID: Michael Bowers, male    DOB: 11-Sep-1945, 67 y.o.   MRN: [id number]  HPI Patient presents today for follow up of chronic health conditions. He has hypertension. A statin is listed on his medication list but he denies ever taking.  He reports taking medications as directed. Reports taking blood pressure at home periodically and ranges 120-160's/70's. He denies smoking. 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 He reports eating a regular diet.     Review of Systems  Constitutional: Negative for fever and chills.  HENT: Negative for neck pain and neck stiffness.   Respiratory: Negative for chest tightness and shortness of breath.   Cardiovascular: Negative for chest pain and palpitations.  Gastrointestinal: Negative for vomiting, abdominal pain, diarrhea, constipation and blood in stool.  Genitourinary: Negative for dysuria, hematuria and difficulty urinating.  Musculoskeletal: Negative for back pain.  Neurological: Negative for dizziness, weakness and headaches.       Objective:   Physical Exam  Constitutional: He is oriented to person, place, and time. He appears well-developed and well-nourished.  HENT:  Head: Normocephalic and atraumatic.  Right Ear: External ear normal.  Left Ear: External ear normal.  Mouth/Throat: Oropharynx is clear and moist.  Eyes: EOM are normal.  Neck: Normal range of motion. Neck supple. No thyromegaly present.  Cardiovascular: Normal rate, regular rhythm and normal heart sounds.   Pulmonary/Chest: Effort normal and breath sounds normal. No respiratory distress. He exhibits no tenderness.  Abdominal: Soft. Bowel sounds are normal. He exhibits no distension and no mass.  Lymphadenopathy:    He has no cervical adenopathy.  Neurological: He is alert and oriented to person, place, and time.  Skin: Skin is warm and dry.          Assessment & Plan:  1. Follow-up exam, 3-6 months since previous exam - POCT  CBC - NMR Lipoprofile with Lipids - COMPLETE METABOLIC PANEL WITH GFR  2. Essential hypertension, benign - amLODipine (NORVASC) 10 MG tablet; Take 1 tablet (10 mg total) by mouth daily.  Dispense: 30 tablet; Refill: 3 - hydrochlorothiazide (HYDRODIURIL) 25 MG tablet; Take 1 tablet (25 mg total) by mouth daily.  Dispense: 30 tablet; Refill: 3 - olmesartan (BENICAR) 40 MG tablet; Take 1 tablet (40 mg total) by mouth daily.  Dispense: 30 tablet; Refill: 3 Continue all current medications Labs pending F/u in 3 months and sooner if symptoms develop or seek emergency medical treatment Discussed regular exercise and healthy eating habits Patient verbalized understanding Coralie Keens, FNP-C

## 2012-10-11 NOTE — Patient Instructions (Signed)

## 2012-10-15 LAB — NMR LIPOPROFILE WITH LIPIDS
HDL Size: 9.5 nm (ref >=9.2)
HDL-C: 27 mg/dL — ABNORMAL LOW (ref >=40)
LDL Size: 20.5 nm — ABNORMAL LOW (ref >20.5)
Large HDL-P: 3.3 umol/L — ABNORMAL LOW (ref >=4.8)
Large VLDL-P: 1.1 nmol/L (ref <=2.7)

## 2012-11-20 ENCOUNTER — Ambulatory Visit (INDEPENDENT_AMBULATORY_CARE_PROVIDER_SITE_OTHER): Payer: Medicare Other | Admitting: Cardiology

## 2012-11-20 ENCOUNTER — Encounter: Payer: Self-pay | Admitting: Cardiology

## 2012-11-20 VITALS — BP 161/95 | HR 67 | Ht 67.0 in | Wt 175.0 lb

## 2012-11-20 DIAGNOSIS — I25810 Atherosclerosis of coronary artery bypass graft(s) without angina pectoris: Secondary | ICD-10-CM

## 2012-11-20 NOTE — Patient Instructions (Addendum)
The current medical regimen is effective;  continue present plan and medications.  Your physician has requested that you have an exercise tolerance test. For further information please visit [email or web address]. Please also follow instruction sheet, as given.  Follow up in 1 year with Dr Hochrein.  You will receive a letter in the mail 2 months before you are due.  Please call us when you receive this letter to schedule your follow up appointment.  

## 2012-11-20 NOTE — Progress Notes (Signed)
   HPI The patient presents for followup of HTN and CAD.  At the last visit I asked for some blood pressure readings. These have been normal though he only brought a few for me and I'm not sure he has been taking them consistently.  The patient denies any new symptoms such as chest discomfort, neck or arm discomfort. There has been no new shortness of breath, PND or orthopnea. There have been no reported palpitations, presyncope or syncope.  He says he remains active doing such things as "pitching horseshoes."  No Known Allergies  Current Outpatient Prescriptions  Medication Sig Dispense Refill  . amLODipine (NORVASC) 10 MG tablet Take 1 tablet (10 mg total) by mouth daily.  30 tablet  3  . aspirin 81 MG tablet Take 81 mg by mouth daily.      . hydrochlorothiazide (HYDRODIURIL) 25 MG tablet Take 1 tablet (25 mg total) by mouth daily.  30 tablet  3  . metoprolol succinate (TOPROL-XL) 100 MG 24 hr tablet Take 1 tablet (100 mg total) by mouth daily. Take with or immediately following a meal.  30 tablet  11  . olmesartan (BENICAR) 40 MG tablet Take 1 tablet (40 mg total) by mouth daily.  30 tablet  3   No current facility-administered medications for this visit.    Past Medical History  Diagnosis Date  . HTN (hypertension)   . CAD (coronary artery disease)     angioplasty  . CKD (chronic kidney disease)   . ED (erectile dysfunction)     Past Surgical History  Procedure Laterality Date  . Angioplasty         . Colonoscopy  10/2007    Dr. Madilyn Fireman, inflammatory polyp     ROS:  As stated in the HPI and negative for all other systems.   PHYSICAL EXAM BP 161/95  Pulse 67  Ht 5\' 7"  (1.702 m)  Wt 175 lb (79.379 kg)  BMI 27.4 kg/m2 GENERAL:  Well appearing HEENT:  Pupils equal round and reactive, fundi not visualized, oral mucosa unremarkable NECK:  No jugular venous distention, waveform within normal limits, carotid upstroke brisk and symmetric, no bruits, no thyromegaly LUNGS:  Clear  to auscultation bilaterally BACK:  No CVA tenderness CHEST:  Unremarkable HEART:  PMI not displaced or sustained,S1 and S2 within normal limits, no S3, no S4, no clicks, no rubs, no murmurs ABD:  Flat, positive bowel sounds normal in frequency in pitch, no bruits, no rebound, no guarding, no midline pulsatile mass, no hepatomegaly, no splenomegaly EXT:  2 plus pulses throughout, no edema, no cyanosis no clubbing  EKG:  Sinus rhythm, rate 68, axis within normal limits, intervals within normal limits, no acute ST-T wave changes.   ASSESSMENT AND PLAN  CAD - The patient has no new sypmtoms.  However, it has been many years since he was last evaluated.  I will bring the patient back for a POET (Plain Old Exercise Test). This will allow me to screen for obstructive coronary disease, risk stratify and very importantly provide a prescription for exercise.  HYPERTENSION - He was told to keep a blood pressure diary he keep a few readings. These are all control at home. I will reassess this at the time of his treadmill test. For now he will remain on the meds as listed.  CKD - This is almost definitely related to his hypertension. This will be followed by his primary provider.

## 2012-12-19 ENCOUNTER — Ambulatory Visit (INDEPENDENT_AMBULATORY_CARE_PROVIDER_SITE_OTHER): Payer: Medicare Other | Admitting: Physician Assistant

## 2012-12-19 DIAGNOSIS — I25810 Atherosclerosis of coronary artery bypass graft(s) without angina pectoris: Secondary | ICD-10-CM

## 2012-12-19 NOTE — Patient Instructions (Addendum)
Your physician has requested that you have a lexiscan myoview. DX 414.04. For further information please visit [email or web address] Please follow instruction sheet, as given.

## 2012-12-19 NOTE — Progress Notes (Signed)
Exercise Treadmill Test  Pre-Exercise Testing Evaluation Rhythm: normal sinus  Rate: 80     Test  Exercise Tolerance Test Ordering MD: Angelina Sheriff, MD  Interpreting MD: Tereso Newcomer, PA-C  Unique Test No: 1  Treadmill:  1  Indication for ETT: known ASHD  Contraindication to ETT: No   Stress Modality: exercise - treadmill  Cardiac Imaging Performed: non   Protocol: standard Bruce - maximal  Max BP:  221/92  Max MPHR (bpm):  152 85% MPR (bpm):  129  MPHR obtained (bpm):  144 % MPHR obtained:  95  Reached 85% MPHR (min:sec):  3:20 Total Exercise Time (min-sec):  6:00  Workload in METS:  7.0 Borg Scale: 15  Reason ETT Terminated:  fatigue    ST Segment Analysis At Rest: normal ST segments - no evidence of significant ST depression With Exercise: significant ischemic ST depression  Other Information Arrhythmia:  No Angina during ETT:  absent (0) Quality of ETT:  diagnostic  ETT Interpretation:  abnormal - evidence of ST depression consistent with ischemia  Comments: Fair exercise capacity. No chest pain. Hypertensive BP response to exercise. There was 1-2 mm of inf-lat ST segment depression at peak exercise that resolved fairly quickly in recovery. However, there was development of inf-lat TWI in recovery. HR recovery in 1st minute post exercise was poor.   Recommendations: I did review with Dr. Rollene Rotunda. Will schedule Lexiscan Myoview. Signed,  Tereso Newcomer, PA-C   12/19/2012 11:22 AM

## 2012-12-31 ENCOUNTER — Ambulatory Visit (HOSPITAL_COMMUNITY): Payer: Medicare Other | Attending: Cardiology | Admitting: Radiology

## 2012-12-31 VITALS — BP 145/81 | HR 63 | Ht 67.0 in | Wt 169.0 lb

## 2012-12-31 DIAGNOSIS — I10 Essential (primary) hypertension: Secondary | ICD-10-CM | POA: Insufficient documentation

## 2012-12-31 DIAGNOSIS — R9439 Abnormal result of other cardiovascular function study: Secondary | ICD-10-CM

## 2012-12-31 DIAGNOSIS — Z9861 Coronary angioplasty status: Secondary | ICD-10-CM | POA: Insufficient documentation

## 2012-12-31 DIAGNOSIS — I25810 Atherosclerosis of coronary artery bypass graft(s) without angina pectoris: Secondary | ICD-10-CM | POA: Insufficient documentation

## 2012-12-31 MED ORDER — TECHNETIUM TC 99M SESTAMIBI GENERIC - CARDIOLITE
11.0000 | Freq: Once | INTRAVENOUS | Status: AC | PRN
  Administered 2012-12-31: 11 via INTRAVENOUS

## 2012-12-31 MED ORDER — REGADENOSON 0.4 MG/5ML IV SOLN
0.4000 mg | Freq: Once | INTRAVENOUS | Status: AC
  Administered 2012-12-31: 0.4 mg via INTRAVENOUS

## 2012-12-31 MED ORDER — TECHNETIUM TC 99M SESTAMIBI GENERIC - CARDIOLITE
33.0000 | Freq: Once | INTRAVENOUS | Status: AC | PRN
  Administered 2012-12-31: 33 via INTRAVENOUS

## 2012-12-31 NOTE — Progress Notes (Signed)
[facility] SITE 3 NUCLEAR MED [address] [place], [place] [address] [phone]    Cardiology Nuclear Med Study  Michael Bowers is a 67 y.o. male     MRN : [id number]     DOB: 23-Nov-1945  Procedure Date: 12/31/2012  Nuclear Med Background Indication for Stress Test:  Evaluation for Ischemia, PTCA Patency and Abnormal GXT  History:  '01 Cath>PTCA of CFX and N/O of LAD,RCA,EF=55%,12/19/12 GXT: 1-67mm ST depression of inferior/lateral  Cardiac Risk Factors: Hypertension  Symptoms:  No cardiac symptoms   Nuclear Pre-Procedure Caffeine/Decaff Intake:  None NPO After: 9:00pm    O2 Sat: 98% on room air. IV 0.9% NS with Angio Cath:  22g  IV Site: R Antecubital  IV Started by:  Rickard Patience  Chest Size (in):  48 Cup Size: n/a  Height: 5\' 7"  (1.702 m)  Weight:  169 lb (76.658 kg)  BMI:  Body mass index is 26.46 kg/(m^2). Tech Comments:  Toprol taken at 0600    Nuclear Med Study 1 or 2 day study: 1 day  Stress Test Type:  Treadmill/Lexiscan  Reading MD: Cassell Clement, MD  Order Authorizing Provider:  Melany Guernsey and Brynda Rim  Resting Radionuclide: Technetium 48m Sestamibi  Resting Radionuclide Dose: 11.0 mCi   Stress Radionuclide:  Technetium 23m Sestamibi  Stress Radionuclide Dose: 33.0 mCi           Stress Protocol Rest HR: 63 Stress HR: 111  Rest BP: 145/81 Stress BP: 187/98  Exercise Time (min): 2:00 METS: n/a   Predicted Max HR: 152 bpm % Max HR: 73.03 bpm Rate Pressure Product: 21308   Dose of Adenosine (mg):  n/a Dose of Lexiscan: 0.4 mg  Dose of Atropine (mg): n/a Dose of Dobutamine: n/a mcg/kg/min (at max HR)  Stress Test Technologist: Nelson Chimes, BS-ES  Nuclear Technologist:  Domenic Polite, CNMT     Rest Procedure:  Myocardial perfusion imaging was performed at rest 45 minutes following the intravenous administration of Technetium 62m Sestamibi. Rest ECG: NSR - Normal EKG  Stress Procedure:  The patient received IV  Lexiscan 0.4 mg over 15-seconds with concurrent low level exercise and then Technetium 67m Sestamibi was injected at 30-seconds while the patient continued walking one more minute.  Quantitative spect images were obtained after a 45-minute delay. Stress ECG: No significant change from baseline ECG  QPS Raw Data Images:  Normal; no motion artifact; normal heart/lung ratio. Stress Images:  Normal homogeneous uptake in all areas of the myocardium. Rest Images:  Normal homogeneous uptake in all areas of the myocardium. Subtraction (SDS):  No evidence of ischemia. Transient Ischemic Dilatation (Normal <1.22):  n/a Lung/Heart Ratio (Normal <0.45):  0.36  Quantitative Gated Spect Images QGS EDV:  106 ml QGS ESV:  45 ml  Impression Exercise Capacity:  Lexiscan with low level exercise. BP Response:  Normal blood pressure response. Clinical Symptoms:  No symptoms. ECG Impression:  No significant ST segment change suggestive of ischemia. Comparison with Prior Nuclear Study: No previous nuclear study performed  Overall Impression:  Normal stress nuclear study.   Normal apical thinning seen. [name].  LV Ejection Fraction: 58%.  LV Wall Motion:  NL LV Function; NL Wall Motion  [employer]

## 2013-01-01 ENCOUNTER — Telehealth: Payer: Self-pay | Admitting: Cardiology

## 2013-01-01 ENCOUNTER — Encounter: Payer: Self-pay | Admitting: Physician Assistant

## 2013-01-01 NOTE — Telephone Encounter (Signed)
Patient called in to get results from "stress test". Informed patient that Tereso Newcomer, PA, made notation that the Stress Test was "normal". Patient has no questions or concerns and he appreciated the call back today!

## 2013-01-01 NOTE — Telephone Encounter (Signed)
Follow Up  ° °Pt returned call for results °

## 2013-01-15 ENCOUNTER — Other Ambulatory Visit: Payer: Self-pay | Admitting: Cardiology

## 2013-02-14 ENCOUNTER — Other Ambulatory Visit: Payer: Self-pay | Admitting: General Practice

## 2013-04-13 ENCOUNTER — Other Ambulatory Visit: Payer: Self-pay | Admitting: General Practice

## 2013-04-15 NOTE — Telephone Encounter (Signed)
Last seen 10/11/12  Mae

## 2013-05-16 ENCOUNTER — Other Ambulatory Visit: Payer: Self-pay | Admitting: General Practice

## 2013-05-20 ENCOUNTER — Ambulatory Visit: Payer: Medicare Other | Admitting: Family Medicine

## 2013-05-23 ENCOUNTER — Ambulatory Visit (INDEPENDENT_AMBULATORY_CARE_PROVIDER_SITE_OTHER): Payer: Medicare Other | Admitting: Nurse Practitioner

## 2013-05-23 ENCOUNTER — Encounter: Payer: Self-pay | Admitting: Nurse Practitioner

## 2013-05-23 VITALS — BP 139/83 | HR 86 | Temp 97.3°F | Ht 67.0 in | Wt 157.0 lb

## 2013-05-23 DIAGNOSIS — R143 Flatulence: Secondary | ICD-10-CM

## 2013-05-23 DIAGNOSIS — R160 Hepatomegaly, not elsewhere classified: Secondary | ICD-10-CM

## 2013-05-23 DIAGNOSIS — R142 Eructation: Secondary | ICD-10-CM

## 2013-05-23 DIAGNOSIS — R141 Gas pain: Secondary | ICD-10-CM

## 2013-05-23 NOTE — Progress Notes (Signed)
   Subjective:    Patient ID: Michael Bowers, male    DOB: 09/30/1945, 68 y.o.   MRN: [id number]  HPI Patient in c/o stomach problems- he says that he is very gassy- says that it is worse after he eats- feels like food is not digesting or is digesting slowly- started about 4 weeks ago- worsening.    Review of Systems  Constitutional: Negative.   HENT: Negative.   Respiratory: Negative.   Cardiovascular: Negative.   Gastrointestinal: Positive for abdominal distention. Negative for nausea, vomiting, diarrhea, constipation and blood in stool.  All other systems reviewed and are negative.       Objective:   Physical Exam  Constitutional: He appears well-developed and well-nourished.  Cardiovascular: Normal rate, regular rhythm and normal heart sounds.   Pulmonary/Chest: Effort normal and breath sounds normal.  Abdominal: Soft. Bowel sounds are normal. He exhibits distension and mass (enlarged liver). There is tenderness.  Skin: Skin is warm and dry.  Psychiatric: He has a normal mood and affect. His behavior is normal. Judgment and thought content normal.    BP 139/83  Pulse 86  Temp(Src) 97.3 F (36.3 C) (Oral)  Ht $R'5\' 7"'xx$  (1.702 m)  Wt 157 lb (71.215 kg)  BMI 24.58 kg/m2       Assessment & Plan:   1. Enlarged liver   2. Belching    . Orders Placed This Encounter  Procedures  . CT Abdomen W Contrast    Standing Status: Future     Number of Occurrences:      Standing Expiration Date: 08/21/2014    Order Specific Question:  Reason for Exam (SYMPTOM  OR DIAGNOSIS REQUIRED)    Answer:  enlarged liver    Order Specific Question:  Preferred imaging location?    Answer:  [facility]  . Amylase  . Lipase  . H Pylori, IGM, IGG, IGA AB   gasx or beano OTC AVoid spicy and fatty foods Labs pending as well abd CT  Mary-Margaret Hassell Done, FNP

## 2013-05-23 NOTE — Patient Instructions (Signed)
Hepatomegaly  Hepatomegaly means the liver is larger than normal (enlarged). Some health problems can cause the liver to get bigger. Some people have an enlarged liver but do not know it.   CAUSES  Possible causes of hepatomegaly include:  · Liver disease, such as:  · Cirrhosis. This is long-term (chronic) liver damage often caused by drinking too much alcohol. Cirrhosis may also be caused by other liver problems.  · Hepatitis. This is an infection of the liver.  · Fatty liver disease.  · Disorders that cause things to accumulate in the liver (Wilson's disease, amyloidosis, hemochromatosis).  · Cancer. The disease may start in the liver, or cancer may start somewhere else in the body and spread to the liver.  · Heart or blood vessel disease. These can cause hepatomegaly if blood backs up into the liver.  SYMPTOMS   Some people have no symptoms. If symptoms are present, they may include:  · Abdominal pain on the right side.  · Fatigue.  · Loss of appetite.  · Nausea.  · Vomiting.  · Yellowing of the skin and whites of the eyes (jaundice).  DIAGNOSIS   To decide if your liver is enlarged, a caregiver will ask about your history and perform a physical exam. The caregiver may press on the right side of your abdomen to feel your liver. This is a way to check if the edge of your liver sticks out below your rib cage. Your caregiver may also order some tests that include:  · Blood tests. These tests check whether your liver is working like it should be. They also check for infection.  · Imaging tests. These are tests that take pictures of your liver. They may include:  · A computed tomography (CT) scan. This is an X-ray guided by a computer.  · Magnetic resonance imaging (MRI). This test creates pictures by using magnets and a computer.  · An ultrasound. Images are made using sound waves.  · Liver biopsy. A small sample of liver tissue is taken out and examined under a microscope.  TREATMENT   Treatment of hepatomegaly  depends on what is causing it.  HOME CARE INSTRUCTIONS  What you need to do at home depends on the cause of your hepatomegaly. In general:  · Take all medicine as directed by your caregiver. Follow the directions carefully. Do not start taking any new medicine unless your caregiver says it is okay. This includes over-the-counter medicines, supplements, and herbal remedies. Some of these can hurt your liver.  · Stay at a healthy weight.  · Follow a healthy diet. Eat lots of fruits, vegetables, and whole grains.  · Do not drink alcohol.  · Do not smoke.  · Keep all follow-up appointments to make sure your treatment is working and your liver stays healthy.  SEEK MEDICAL CARE IF:  · You have increased or localized abdominal pain.  · You have persistent vomiting.  SEEK IMMEDIATE MEDICAL CARE IF:   · You vomit bright red blood or blood that looks like coffee grounds.  · You have chest pain.  · You have trouble breathing.  MAKE SURE YOU:  · Understand these instructions.  · Will watch your condition.  · Will get help right away if you are not doing well or get worse.  Document Released: 06/05/2011 Document Reviewed: 06/05/2011  ExitCare® Patient Information ©2014 [employer].

## 2013-05-27 ENCOUNTER — Other Ambulatory Visit: Payer: Self-pay | Admitting: Nurse Practitioner

## 2013-05-27 LAB — H PYLORI, IGM, IGG, IGA AB
H Pylori IgG: 5.6 U/mL — ABNORMAL HIGH (ref 0.0–0.8)
H. pylori, IgA Abs: 30.6 units — ABNORMAL HIGH (ref 0.0–8.9)
H. pylori, IgM Abs: <9 units (ref 0.0–8.9)

## 2013-05-27 LAB — CMP14+EGFR
ALK PHOS: 673 IU/L — AB (ref 39–117)
ALT: 35 IU/L (ref 0–44)
AST: 34 IU/L (ref 0–40)
Albumin/Globulin Ratio: 0.7 — ABNORMAL LOW (ref 1.1–2.5)
Albumin: 3.4 g/dL — ABNORMAL LOW (ref 3.6–4.8)
BUN/Creatinine Ratio: 15 (ref 10–22)
BUN: 33 mg/dL — AB (ref 8–27)
CHLORIDE: 97 mmol/L (ref 97–108)
CO2: 23 mmol/L (ref 18–29)
CREATININE: 2.19 mg/dL — AB (ref 0.76–1.27)
Calcium: 8.9 mg/dL (ref 8.6–10.2)
GFR calc Af Amer: 34 mL/min/{1.73_m2} — ABNORMAL LOW (ref >59)
GFR calc non Af Amer: 30 mL/min/{1.73_m2} — ABNORMAL LOW (ref >59)
GLOBULIN, TOTAL: 5 g/dL — AB (ref 1.5–4.5)
Glucose: 102 mg/dL — ABNORMAL HIGH (ref 65–99)
Potassium: 3.5 mmol/L (ref 3.5–5.2)
SODIUM: 136 mmol/L (ref 134–144)
Total Bilirubin: 0.9 mg/dL (ref 0.0–1.2)
Total Protein: 8.4 g/dL (ref 6.0–8.5)

## 2013-05-27 LAB — AMYLASE: AMYLASE: 68 U/L (ref 31–124)

## 2013-05-27 LAB — LIPASE: LIPASE: 38 U/L (ref 0–59)

## 2013-05-27 MED ORDER — CLARITHROMYCIN 500 MG PO TABS: 500.0000 mg | ORAL_TABLET | Freq: Two times a day (BID) | ORAL | Status: DC

## 2013-05-27 MED ORDER — LANSOPRAZOLE 30 MG PO CPDR: 30.0000 mg | DELAYED_RELEASE_CAPSULE | Freq: Every day | ORAL | Status: DC

## 2013-05-27 MED ORDER — AMOXICILLIN 500 MG PO CAPS: 500.0000 mg | ORAL_CAPSULE | Freq: Three times a day (TID) | ORAL | Status: DC

## 2013-05-29 ENCOUNTER — Other Ambulatory Visit: Payer: Self-pay | Admitting: Nurse Practitioner

## 2013-05-29 ENCOUNTER — Ambulatory Visit (HOSPITAL_COMMUNITY)
Admission: RE | Admit: 2013-05-29 | Discharge: 2013-05-29 | Disposition: A | Discharge status: Home / Self Care | Payer: Medicare Other | Source: Ambulatory Visit | Attending: Nurse Practitioner | Admitting: Nurse Practitioner

## 2013-05-29 DIAGNOSIS — M47817 Spondylosis without myelopathy or radiculopathy, lumbosacral region: Secondary | ICD-10-CM | POA: Insufficient documentation

## 2013-05-29 DIAGNOSIS — R160 Hepatomegaly, not elsewhere classified: Secondary | ICD-10-CM

## 2013-05-29 DIAGNOSIS — R109 Unspecified abdominal pain: Secondary | ICD-10-CM | POA: Insufficient documentation

## 2013-05-29 DIAGNOSIS — R188 Other ascites: Secondary | ICD-10-CM | POA: Insufficient documentation

## 2013-05-30 ENCOUNTER — Encounter: Payer: Self-pay | Admitting: Physician Assistant

## 2013-06-03 ENCOUNTER — Telehealth: Payer: Self-pay | Admitting: *Deleted

## 2013-06-03 MED ORDER — OMEPRAZOLE 40 MG PO CPDR: 40.0000 mg | DELAYED_RELEASE_CAPSULE | Freq: Every day | ORAL | Status: DC

## 2013-06-03 NOTE — Telephone Encounter (Signed)
Mm.lansoprazole is not on this man's formulary, however dexilant, nexium are being a tier 2 and omeprazole 20 is on the formulary and is a tier 1 neither of these have to be prior approved, will either of them work?  thanks

## 2013-06-03 NOTE — Telephone Encounter (Signed)
rx sent to pharmacy

## 2013-06-03 NOTE — Telephone Encounter (Signed)
Please tell patient that will need to try something different for his GERD. I sthat ok- Insurance won't cover current meds

## 2013-06-03 NOTE — Addendum Note (Signed)
Addended by: Earlene Plater on: 06/03/2013 03:24 PM   Modules accepted: Orders

## 2013-06-03 NOTE — Telephone Encounter (Signed)
Patient is ok with changing to something else

## 2013-06-04 ENCOUNTER — Ambulatory Visit (HOSPITAL_COMMUNITY)
Admission: RE | Admit: 2013-06-04 | Discharge: 2013-06-04 | Disposition: A | Discharge status: Home / Self Care | Payer: Medicare Other | Source: Ambulatory Visit | Attending: Nurse Practitioner | Admitting: Nurse Practitioner

## 2013-06-04 DIAGNOSIS — K7689 Other specified diseases of liver: Secondary | ICD-10-CM | POA: Insufficient documentation

## 2013-06-04 DIAGNOSIS — R160 Hepatomegaly, not elsewhere classified: Secondary | ICD-10-CM

## 2013-06-05 ENCOUNTER — Other Ambulatory Visit (INDEPENDENT_AMBULATORY_CARE_PROVIDER_SITE_OTHER): Payer: Medicare Other

## 2013-06-05 DIAGNOSIS — R160 Hepatomegaly, not elsewhere classified: Secondary | ICD-10-CM

## 2013-06-05 NOTE — Progress Notes (Signed)
Patient came in for labs only.

## 2013-06-06 ENCOUNTER — Encounter: Payer: Self-pay | Admitting: Physician Assistant

## 2013-06-06 ENCOUNTER — Other Ambulatory Visit (INDEPENDENT_AMBULATORY_CARE_PROVIDER_SITE_OTHER): Payer: Medicare Other

## 2013-06-06 ENCOUNTER — Ambulatory Visit (INDEPENDENT_AMBULATORY_CARE_PROVIDER_SITE_OTHER): Payer: Medicare Other | Admitting: Physician Assistant

## 2013-06-06 VITALS — BP 130/68 | HR 92 | Ht 67.0 in | Wt 160.0 lb

## 2013-06-06 DIAGNOSIS — R6889 Other general symptoms and signs: Secondary | ICD-10-CM

## 2013-06-06 DIAGNOSIS — R142 Eructation: Secondary | ICD-10-CM

## 2013-06-06 DIAGNOSIS — R141 Gas pain: Secondary | ICD-10-CM

## 2013-06-06 DIAGNOSIS — R143 Flatulence: Secondary | ICD-10-CM

## 2013-06-06 DIAGNOSIS — R1013 Epigastric pain: Secondary | ICD-10-CM

## 2013-06-06 DIAGNOSIS — R160 Hepatomegaly, not elsewhere classified: Secondary | ICD-10-CM

## 2013-06-06 DIAGNOSIS — K769 Liver disease, unspecified: Secondary | ICD-10-CM

## 2013-06-06 DIAGNOSIS — R9389 Abnormal findings on diagnostic imaging of other specified body structures: Secondary | ICD-10-CM

## 2013-06-06 DIAGNOSIS — R140 Abdominal distension (gaseous): Secondary | ICD-10-CM

## 2013-06-06 LAB — COMPREHENSIVE METABOLIC PANEL
ALBUMIN: 2.6 g/dL — AB (ref 3.5–5.2)
ALT: 39 U/L (ref 0–53)
AST: 52 U/L — ABNORMAL HIGH (ref 0–37)
Alkaline Phosphatase: 673 U/L — ABNORMAL HIGH (ref 39–117)
BUN: 38 mg/dL — AB (ref 6–23)
CO2: 22 mEq/L (ref 19–32)
Calcium: 9.1 mg/dL (ref 8.4–10.5)
Chloride: 102 mEq/L (ref 96–112)
Creatinine, Ser: 2.5 mg/dL — ABNORMAL HIGH (ref 0.4–1.5)
GFR: 32.92 mL/min — ABNORMAL LOW (ref >60.00)
GLUCOSE: 96 mg/dL (ref 70–99)
POTASSIUM: 3.9 meq/L (ref 3.5–5.1)
Sodium: 132 mEq/L — ABNORMAL LOW (ref 135–145)
TOTAL PROTEIN: 8.5 g/dL — AB (ref 6.0–8.3)
Total Bilirubin: 1.5 mg/dL — ABNORMAL HIGH (ref 0.3–1.2)

## 2013-06-06 LAB — CBC WITH DIFFERENTIAL/PLATELET
BASOS PCT: 0.2 % (ref 0.0–3.0)
Basophils Absolute: 0 10*3/uL (ref 0.0–0.1)
EOS PCT: 0 % (ref 0.0–5.0)
Eosinophils Absolute: 0 10*3/uL (ref 0.0–0.7)
HCT: 29.3 % — ABNORMAL LOW (ref 39.0–52.0)
HEMOGLOBIN: 9.7 g/dL — AB (ref 13.0–17.0)
LYMPHS PCT: 10.2 % — AB (ref 12.0–46.0)
Lymphs Abs: 1.6 10*3/uL (ref 0.7–4.0)
MCHC: 33.3 g/dL (ref 30.0–36.0)
MCV: 85.4 fl (ref 78.0–100.0)
Monocytes Absolute: 5.1 10*3/uL — ABNORMAL HIGH (ref 0.1–1.0)
Monocytes Relative: 32.3 % — ABNORMAL HIGH (ref 3.0–12.0)
NEUTROS ABS: 9 10*3/uL — AB (ref 1.4–7.7)
NEUTROS PCT: 57.3 % (ref 43.0–77.0)
Platelets: 141 10*3/uL — ABNORMAL LOW (ref 150.0–400.0)
RBC: 3.43 Mil/uL — ABNORMAL LOW (ref 4.22–5.81)
RDW: 15.5 % — ABNORMAL HIGH (ref 11.5–14.6)
WBC: 15.7 10*3/uL — AB (ref 4.5–10.5)

## 2013-06-06 LAB — HEPATITIS PANEL, ACUTE
HEP B C IGM: NEGATIVE
HEP B S AG: NEGATIVE
Hep A IgM: NEGATIVE
Hep C Virus Ab: <0.1 s/co ratio (ref 0.0–0.9)

## 2013-06-06 LAB — FERRITIN: Ferritin: 936.3 ng/mL — ABNORMAL HIGH (ref 22.0–322.0)

## 2013-06-06 LAB — PROTIME-INR
INR: 1.6 ratio — ABNORMAL HIGH (ref 0.8–1.0)
Prothrombin Time: 16.7 s — ABNORMAL HIGH (ref 10.2–12.4)

## 2013-06-06 NOTE — Progress Notes (Signed)
Reviewed and agree with management. Lyrika Souders D. Lucas Exline, M.D., FACG  

## 2013-06-06 NOTE — Patient Instructions (Signed)
Please go to the basement level to have your labs drawn.  You have been scheduled for an endoscopy with propofol. Please follow written instructions given to you at your visit today. If you use inhalers (even only as needed), please bring them with you on the day of your procedure.  We scheduled the Ultrasound Paracentesis at [facility]. Go to patient registration inside front door of [facility]. Date is Wed 06-11-2013. Arrive at 12:45 PM.   No dietary restrictions.

## 2013-06-06 NOTE — Progress Notes (Signed)
Subjective:    Patient ID: DANN GALICIA, male    DOB: 03-23-1946, 68 y.o.   MRN: [id number]  HPI  Ceaser is a pleasant 68 year old African American male referred today by [facility] family    practice for evaluation of hepatomegaly. Patient has history of coronary artery disease is status post CABG, also with history of chronic renal insufficiency and hypertension.  He states that he started having upper abdominal discomfort about 4-6 weeks ago with a feeling of gas and bloating in his upper abdomen. He has not really had any pain but says that his abdomen has become distended and tight. His appetite has been good his weight has been stable. He has not had any nausea or vomiting. He denies any dysphagia or odynophagia. His bowel movements have been a bit more frequent but no diarrhea melena or hematochezia.  Patient was noted on exam to have an enlarged liver and spleen and was sent for CT scan of the abdomen and pelvis.  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 Labs were done with a negative acute hepatitis panel, most recent creatinine 2.1 LFTs show total bilirubin of 0.9 AST of 34 ALT of 35 an alkaline phosphatase of 673.  Patient has no prior history of liver problems, no family history of liver disease, no history of EtOH abuse.Marland Kitchen He relates that he did have a previous colonoscopy about 6 years ago done at [facility] but was unable to recall who did this. We were able to find a colonoscopy per Dr. Amedeo Plenty done in 2009 for Hemoccult-positive stool he had one polyp in the  transverse colon which was removed and was an inflammatory polyp.    Review of Systems  Constitutional: Negative.   HENT: Negative.   Eyes: Negative.   Respiratory: Negative.   Cardiovascular: Negative.   Gastrointestinal: Positive for abdominal pain and abdominal distention.  Endocrine: Negative.   Musculoskeletal: Negative.   Skin: Negative.   Allergic/Immunologic: Negative.   Neurological: Negative.   Hematological: Negative.   Psychiatric/Behavioral: Negative.    Outpatient Prescriptions Prior to Visit  Medication Sig Dispense Refill  . amLODipine (NORVASC) 10 MG tablet TAKE ONE TABLET BY MOUTH ONE TIME DAILY  30 tablet  0  . amoxicillin (AMOXIL) 500 MG capsule Take 1 capsule (500 mg total) by mouth 3 (three) times daily.  28 capsule  0  . aspirin 81 MG tablet Take 81 mg by mouth daily.      Marland Kitchen BENICAR 40 MG tablet TAKE ONE TABLET BY MOUTH ONE TIME DAILY  30 tablet  0  . clarithromycin (BIAXIN) 500 MG tablet Take 1 tablet (500 mg total) by mouth 2 (two) times daily.  28 tablet  0  . hydrochlorothiazide (HYDRODIURIL) 25 MG tablet TAKE ONE TABLET BY MOUTH ONE TIME DAILY  30 tablet  0  . lansoprazole (PREVACID) 30 MG capsule Take 1 capsule (30 mg total) by mouth daily at 12 noon.  28 capsule  0  . metoprolol succinate (TOPROL-XL) 100 MG 24 hr tablet TAKE 1 TABLET BY MOUTH ONCE A DAY WITH OR IMMEDIATELY FOLLOWING A MEALAS INSTRUCTED  30 tablet  10  .  omeprazole (PRILOSEC) 40 MG capsule Take 1 capsule (40 mg total) by mouth daily.  30 capsule  3   No facility-administered medications prior to visit.   No Known Allergies Patient Active Problem List   Diagnosis Date Noted  . CAD (coronary artery disease) of artery bypass graft 01/03/2012  . Chronic constipation 08/17/2010  . Perianal lesion 08/17/2010  . Rectal bleed 08/17/2010  . Anemia 08/17/2010   History  Substance Use Topics  . Smoking status: Never Smoker   . Smokeless tobacco: Never Used  . Alcohol Use: No   family  history includes Hypertension in his brother, brother, brother, father, sister, sister, sister, sister, and sister. There is no history of Colon cancer, Colon polyps, or Liver disease.     Objective:   Physical Exam ; well-developed older African American male in no acute distress, accompanied by 2 family members, pleasant blood pressure 130/68 pulse 92 height 5 foot 7 weight 160. HEENT; nontraumatic normocephalic EOMI PERRLA sclera anicteric, Supple; no JVD, Cardiovascular; regular rate and rhythm with S1-S2 there's occasional ectopic he does have a sternal incisional scar, Pulmonary; clear bilaterally, Abdomen; protuberant somewhat distended, no definite fluid wave, liver is enlarged and palpable at least 3 fingerbreadths below the right costal margin firm ,spleen tip is palpable in the left upper quadrant no other palpable mass bowel sounds are present he is nontender, Rectal ;exam brown Hemoccult-positive stool, Extremities; no clubbing cyanosis or edema skin warm and dry, Psych ;mood and affect appropriate.      Assessment & Plan:  #71   68 year old male with 4-6 week history of upper abdominal discomfort bloating and distention with abnormal CT scan showing hepatomegaly ,splenomegaly ,and pathologically enlarged retroperitoneal lymph nodes in gastrohepatic ligament nodes. 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  patient has significantly elevated alkaline phosphatase. Also noted on CT scan to have thickening of the gallbladder wall without evidence of gallstones or ductal dilation. Primary concern is for an underlying GI malignancy versus cryptogenic cirrhosis.  #2 chronic renal insufficiency #3 coronary artery disease status post CABG #4 hypertension #5 Hemoccult-positive stool  Plan; Long discussion with patient and his family. We'll start evaluation with CBC with differential,  ProTime, repeat C. met, will check tumor markers to include CEA, CA 19-9, and alpha-fetoprotein level. Schedule for ultrasound-guided diagnostic paracentesis. To  her send fluid for cytology, grams staining cell counts and total protein Schedule for upper endoscopy with Dr. Deatra Ina. Procedure discussed in detail with the patient and he is agreeable to proceed. He may require further imaging with MRI -will await results of paracentesis and EGD.

## 2013-06-07 LAB — AFP TUMOR MARKER: AFP-Tumor Marker: 6.8 ng/mL (ref 0.0–8.0)

## 2013-06-07 LAB — CEA: CEA: 1 ng/mL (ref 0.0–5.0)

## 2013-06-07 LAB — CANCER ANTIGEN 19-9: CA 19-9: 21.1 U/mL (ref <35.0)

## 2013-06-10 ENCOUNTER — Other Ambulatory Visit: Payer: Self-pay | Admitting: *Deleted

## 2013-06-10 DIAGNOSIS — R944 Abnormal results of kidney function studies: Secondary | ICD-10-CM

## 2013-06-11 ENCOUNTER — Other Ambulatory Visit: Payer: Self-pay

## 2013-06-11 ENCOUNTER — Other Ambulatory Visit (INDEPENDENT_AMBULATORY_CARE_PROVIDER_SITE_OTHER): Payer: Medicare Other

## 2013-06-11 ENCOUNTER — Telehealth: Payer: Self-pay | Admitting: Nurse Practitioner

## 2013-06-11 ENCOUNTER — Ambulatory Visit (HOSPITAL_COMMUNITY)
Admission: RE | Admit: 2013-06-11 | Discharge: 2013-06-11 | Disposition: A | Discharge status: Home / Self Care | Payer: Medicare Other | Source: Ambulatory Visit | Attending: Physician Assistant | Admitting: Physician Assistant

## 2013-06-11 VITALS — BP 125/70

## 2013-06-11 DIAGNOSIS — R9389 Abnormal findings on diagnostic imaging of other specified body structures: Secondary | ICD-10-CM

## 2013-06-11 DIAGNOSIS — R1013 Epigastric pain: Secondary | ICD-10-CM | POA: Insufficient documentation

## 2013-06-11 DIAGNOSIS — R161 Splenomegaly, not elsewhere classified: Secondary | ICD-10-CM | POA: Insufficient documentation

## 2013-06-11 DIAGNOSIS — R160 Hepatomegaly, not elsewhere classified: Secondary | ICD-10-CM

## 2013-06-11 DIAGNOSIS — K769 Liver disease, unspecified: Secondary | ICD-10-CM | POA: Insufficient documentation

## 2013-06-11 DIAGNOSIS — R188 Other ascites: Secondary | ICD-10-CM

## 2013-06-11 DIAGNOSIS — N189 Chronic kidney disease, unspecified: Secondary | ICD-10-CM | POA: Insufficient documentation

## 2013-06-11 DIAGNOSIS — R109 Unspecified abdominal pain: Secondary | ICD-10-CM | POA: Insufficient documentation

## 2013-06-11 DIAGNOSIS — R944 Abnormal results of kidney function studies: Secondary | ICD-10-CM

## 2013-06-11 DIAGNOSIS — R899 Unspecified abnormal finding in specimens from other organs, systems and tissues: Secondary | ICD-10-CM | POA: Insufficient documentation

## 2013-06-11 DIAGNOSIS — R140 Abdominal distension (gaseous): Secondary | ICD-10-CM

## 2013-06-11 LAB — BASIC METABOLIC PANEL
BUN: 47 mg/dL — AB (ref 6–23)
CHLORIDE: 102 meq/L (ref 96–112)
CO2: 22 mEq/L (ref 19–32)
CREATININE: 2.8 mg/dL — AB (ref 0.4–1.5)
Calcium: 9 mg/dL (ref 8.4–10.5)
GFR: 29.5 mL/min — ABNORMAL LOW (ref >60.00)
GLUCOSE: 102 mg/dL — AB (ref 70–99)
Potassium: 3.4 mEq/L — ABNORMAL LOW (ref 3.5–5.1)
Sodium: 132 mEq/L — ABNORMAL LOW (ref 135–145)

## 2013-06-11 LAB — BODY FLUID CELL COUNT WITH DIFFERENTIAL
Lymphs, Fluid: 63 %
Monocyte-Macrophage-Serous Fluid: 31 % — ABNORMAL LOW (ref 50–90)
Neutrophil Count, Fluid: 6 % (ref 0–25)
Total Nucleated Cell Count, Fluid: 1047 cu mm — ABNORMAL HIGH (ref 0–1000)

## 2013-06-11 LAB — PROTEIN, BODY FLUID: TOTAL PROTEIN, FLUID: 3.2 g/dL

## 2013-06-11 LAB — LACTATE DEHYDROGENASE, PLEURAL OR PERITONEAL FLUID: LD, Fluid: 80 U/L — ABNORMAL HIGH (ref 3–23)

## 2013-06-11 NOTE — Procedures (Signed)
US guided diagnostic paracentesis performed yielding 270 cc's slightly turbid, yellow fluid. The fluid was sent to the lab for preordered studies. No immediate complications.

## 2013-06-11 NOTE — Telephone Encounter (Signed)
Patient aware of labs.  

## 2013-06-12 ENCOUNTER — Other Ambulatory Visit: Payer: Self-pay | Admitting: Gastroenterology

## 2013-06-12 ENCOUNTER — Other Ambulatory Visit: Payer: Self-pay | Admitting: *Deleted

## 2013-06-12 ENCOUNTER — Encounter: Payer: Self-pay | Admitting: Gastroenterology

## 2013-06-12 ENCOUNTER — Ambulatory Visit (AMBULATORY_SURGERY_CENTER): Payer: Medicare Other | Admitting: Gastroenterology

## 2013-06-12 ENCOUNTER — Other Ambulatory Visit: Payer: Medicare Other

## 2013-06-12 VITALS — BP 106/68 | HR 84 | Temp 96.2°F | Resp 26 | Ht 67.0 in | Wt 160.0 lb

## 2013-06-12 DIAGNOSIS — K209 Esophagitis, unspecified without bleeding: Secondary | ICD-10-CM

## 2013-06-12 DIAGNOSIS — R9389 Abnormal findings on diagnostic imaging of other specified body structures: Secondary | ICD-10-CM

## 2013-06-12 DIAGNOSIS — B3781 Candidal esophagitis: Secondary | ICD-10-CM

## 2013-06-12 DIAGNOSIS — R799 Abnormal finding of blood chemistry, unspecified: Secondary | ICD-10-CM

## 2013-06-12 LAB — HIV ANTIBODY (ROUTINE TESTING W REFLEX): HIV: NONREACTIVE

## 2013-06-12 MED ORDER — SODIUM CHLORIDE 0.9 % IV SOLN: 500.0000 mL | INTRAVENOUS | Status: DC

## 2013-06-12 MED ORDER — FLUCONAZOLE 100 MG PO TABS: 100.0000 mg | ORAL_TABLET | Freq: Every day | ORAL | Status: DC

## 2013-06-12 NOTE — Progress Notes (Signed)
Called to room to assist during endoscopic procedure.  Patient ID and intended procedure confirmed with present staff. Received instructions for my participation in the procedure from the performing physician.  

## 2013-06-12 NOTE — Progress Notes (Addendum)
Patient went to lab after procedure.  Previsit is 06/30/13 at 10:00am,  Colonoscopy  07/08/2013 at 4pm arrive at 3pm.

## 2013-06-12 NOTE — Op Note (Signed)
[place]  [address], [id number]   ENDOSCOPY PROCEDURE REPORT  PATIENT: Michael Bowers, Michael Bowers  MR#: [id number] BIRTHDATE: [birth year] , 67  yrs. old GENDER: Male ENDOSCOPIST: Inda Castle, MD REFERRED BY:  Redge Gainer, M.D. PROCEDURE DATE:  06/12/2013 PROCEDURE:  EGD w/ biopsy ASA CLASS:     Class III INDICATIONS:  Screening for varices.   Dyspepsia. MEDICATIONS: MAC sedation, administered by CRNA, propofol (Diprivan) 100mg  IV, and Simethicone 0.6cc PO TOPICAL ANESTHETIC:  DESCRIPTION OF PROCEDURE: After the risks benefits and alternatives of the procedure were thoroughly explained, informed consent was obtained.  The LB [id number] endoscope was introduced through the mouth and advanced to the third portion of the duodenum. Without limitations.  The instrument was slowly withdrawn as the mucosa was fully examined.      Throughout the esophagus the mucosa was coated with a thick white exudate. There was moderate mucosal edema in the gastric cardia consistent with portal hypertensive gastropathy.   The remainder of the upper endoscopy exam was otherwise normal.  Retroflexed views revealed no abnormalities.     The scope was then withdrawn from the patient and the procedure completed.  COMPLICATIONS: There were no complications. ENDOSCOPIC IMPRESSION: 1.   probable Candida esophagitis 2.  portal hypertensive gastropathy  RECOMMENDATIONS: Await pathology results colonoscopy Check HIV  REPEAT EXAM:  eSigned:  Inda Castle, MD 06/12/2013 9:09 AM   CC:  PATIENT NAME:  [name], [name] MR#: [id number]

## 2013-06-12 NOTE — Patient Instructions (Signed)
YOU HAD AN ENDOSCOPIC PROCEDURE TODAY AT [facility]: Refer to the procedure report that was given to you for any specific questions about what was found during the examination.  If the procedure report does not answer your questions, please call your gastroenterologist to clarify.  If you requested that your care partner not be given the details of your procedure findings, then the procedure report has been included in a sealed envelope for you to review at your convenience later.  YOU SHOULD EXPECT: Some feelings of bloating in the abdomen. Passage of more gas than usual.  Walking can help get rid of the air that was put into your GI tract during the procedure and reduce the bloating. If you had a lower endoscopy (such as a colonoscopy or flexible sigmoidoscopy) you may notice spotting of blood in your stool or on the toilet paper. If you underwent a bowel prep for your procedure, then you may not have a normal bowel movement for a few days.  DIET: Your first meal following the procedure should be a light meal and then it is ok to progress to your normal diet.  A half-sandwich or bowl of soup is an example of a good first meal.  Heavy or fried foods are harder to digest and may make you feel nauseous or bloated.  Likewise meals heavy in dairy and vegetables can cause extra gas to form and this can also increase the bloating.  Drink plenty of fluids but you should avoid alcoholic beverages for 24 hours.You may choose to eat a soft diet today if your throat is sore.  ACTIVITY: Your care partner should take you home directly after the procedure.  You should plan to take it easy, moving slowly for the rest of the day.  You can resume normal activity the day after the procedure however you should NOT DRIVE or use heavy machinery for 24 hours (because of the sedation medicines used during the test).    SYMPTOMS TO REPORT IMMEDIATELY: A gastroenterologist can be reached at any hour.  During  normal business hours, 8:30 AM to 5:00 PM Monday through Friday, call [phone].  After hours and on weekends, please call the GI answering service at [phone] who will take a message and have the physician on call contact you.   Following upper endoscopy (EGD)  Vomiting of blood or coffee ground material  New chest pain or pain under the shoulder blades  Painful or persistently difficult swallowing  New shortness of breath  Fever of 100F or higher  Black, tarry-looking stools  FOLLOW UP: If any biopsies were taken you will be contacted by phone or by letter within the next 1-3 weeks.  Call your gastroenterologist if you have not heard about the biopsies in 3 weeks. Check for HIV. Our staff will call the home number listed on your records the next business day following your procedure to check on you and address any questions or concerns that you may have at that time regarding the information given to you following your procedure. This is a courtesy call and so if there is no answer at the home number and we have not heard from you through the emergency physician on call, we will assume that you have returned to your regular daily activities without incident.  SIGNATURES/CONFIDENTIALITY: You and/or your care partner have signed paperwork which will be entered into your electronic medical record.  These signatures attest to the fact that that the information  above on your After Visit Summary has been reviewed and is understood.  Full responsibility of the confidentiality of this discharge information lies with you and/or your care-partner.

## 2013-06-13 ENCOUNTER — Telehealth: Payer: Self-pay

## 2013-06-13 NOTE — Telephone Encounter (Signed)
  Follow up Call-  Call back number 06/12/2013  Post procedure Call Back phone  # [phone]  Permission to leave phone message Yes     Patient questions:  Do you have a fever, pain , or abdominal swelling? no Pain Score  0 *  Have you tolerated food without any problems? yes  Have you been able to return to your normal activities? yes  Do you have any questions about your discharge instructions: Diet   no Medications  no Follow up visit  no  Do you have questions or concerns about your Care? no  Actions: * If pain score is 4 or above: No action needed, pain <4.

## 2013-06-14 LAB — BODY FLUID CULTURE
CULTURE: NO GROWTH
Special Requests: NORMAL

## 2013-06-15 ENCOUNTER — Other Ambulatory Visit: Payer: Self-pay | Admitting: General Practice

## 2013-06-16 ENCOUNTER — Other Ambulatory Visit: Payer: Self-pay | Admitting: *Deleted

## 2013-06-16 MED ORDER — CIPROFLOXACIN HCL 500 MG PO TABS: ORAL_TABLET | ORAL | Status: DC

## 2013-06-23 ENCOUNTER — Other Ambulatory Visit: Payer: Self-pay | Admitting: General Practice

## 2013-06-24 ENCOUNTER — Telehealth: Payer: Self-pay | Admitting: *Deleted

## 2013-06-24 ENCOUNTER — Telehealth: Payer: Self-pay | Admitting: Nurse Practitioner

## 2013-06-24 ENCOUNTER — Ambulatory Visit (INDEPENDENT_AMBULATORY_CARE_PROVIDER_SITE_OTHER): Payer: Medicare Other | Admitting: Physician Assistant

## 2013-06-24 ENCOUNTER — Other Ambulatory Visit (INDEPENDENT_AMBULATORY_CARE_PROVIDER_SITE_OTHER): Payer: Medicare Other

## 2013-06-24 ENCOUNTER — Encounter: Payer: Self-pay | Admitting: Physician Assistant

## 2013-06-24 VITALS — BP 122/82 | HR 70 | Ht 67.0 in | Wt 166.4 lb

## 2013-06-24 DIAGNOSIS — D649 Anemia, unspecified: Secondary | ICD-10-CM

## 2013-06-24 DIAGNOSIS — K7460 Unspecified cirrhosis of liver: Secondary | ICD-10-CM

## 2013-06-24 DIAGNOSIS — K3189 Other diseases of stomach and duodenum: Secondary | ICD-10-CM

## 2013-06-24 DIAGNOSIS — K319 Disease of stomach and duodenum, unspecified: Secondary | ICD-10-CM

## 2013-06-24 DIAGNOSIS — K766 Portal hypertension: Secondary | ICD-10-CM

## 2013-06-24 DIAGNOSIS — R799 Abnormal finding of blood chemistry, unspecified: Secondary | ICD-10-CM

## 2013-06-24 DIAGNOSIS — Z1211 Encounter for screening for malignant neoplasm of colon: Secondary | ICD-10-CM

## 2013-06-24 DIAGNOSIS — R188 Other ascites: Secondary | ICD-10-CM

## 2013-06-24 DIAGNOSIS — R7989 Other specified abnormal findings of blood chemistry: Secondary | ICD-10-CM

## 2013-06-24 LAB — COMPREHENSIVE METABOLIC PANEL
ALBUMIN: 2.5 g/dL — AB (ref 3.5–5.2)
ALT: 38 U/L (ref 0–53)
AST: 42 U/L — ABNORMAL HIGH (ref 0–37)
Alkaline Phosphatase: 829 U/L — ABNORMAL HIGH (ref 39–117)
BILIRUBIN TOTAL: 1.3 mg/dL — AB (ref 0.3–1.2)
BUN: 24 mg/dL — ABNORMAL HIGH (ref 6–23)
CHLORIDE: 111 meq/L (ref 96–112)
CO2: 18 meq/L — AB (ref 19–32)
Calcium: 8.9 mg/dL (ref 8.4–10.5)
Creatinine, Ser: 2.2 mg/dL — ABNORMAL HIGH (ref 0.4–1.5)
GFR: 39.35 mL/min — AB (ref >60.00)
Glucose, Bld: 93 mg/dL (ref 70–99)
POTASSIUM: 4.4 meq/L (ref 3.5–5.1)
SODIUM: 138 meq/L (ref 135–145)
TOTAL PROTEIN: 8.3 g/dL (ref 6.0–8.3)

## 2013-06-24 LAB — CBC WITH DIFFERENTIAL/PLATELET
BASOS ABS: 0 10*3/uL (ref 0.0–0.1)
Basophils Relative: 0.3 % (ref 0.0–3.0)
EOS PCT: 0.9 % (ref 0.0–5.0)
Eosinophils Absolute: 0.1 10*3/uL (ref 0.0–0.7)
HCT: 30.1 % — ABNORMAL LOW (ref 39.0–52.0)
Hemoglobin: 9.8 g/dL — ABNORMAL LOW (ref 13.0–17.0)
Lymphocytes Relative: 12.1 % (ref 12.0–46.0)
Lymphs Abs: 1.5 10*3/uL (ref 0.7–4.0)
MCHC: 32.4 g/dL (ref 30.0–36.0)
MCV: 86.9 fl (ref 78.0–100.0)
Monocytes Absolute: 3.4 10*3/uL — ABNORMAL HIGH (ref 0.1–1.0)
Monocytes Relative: 28.3 % — ABNORMAL HIGH (ref 3.0–12.0)
NEUTROS PCT: 58.4 % (ref 43.0–77.0)
Neutro Abs: 7.1 10*3/uL (ref 1.4–7.7)
PLATELETS: 99 10*3/uL — AB (ref 150.0–400.0)
RBC: 3.46 Mil/uL — ABNORMAL LOW (ref 4.22–5.81)
RDW: 16 % — AB (ref 11.5–14.6)
WBC: 12.1 10*3/uL — ABNORMAL HIGH (ref 4.5–10.5)

## 2013-06-24 LAB — BASIC METABOLIC PANEL
BUN: 24 mg/dL — AB (ref 6–23)
CALCIUM: 8.9 mg/dL (ref 8.4–10.5)
CO2: 18 mEq/L — ABNORMAL LOW (ref 19–32)
Chloride: 111 mEq/L (ref 96–112)
Creatinine, Ser: 2.2 mg/dL — ABNORMAL HIGH (ref 0.4–1.5)
GFR: 39.35 mL/min — ABNORMAL LOW (ref >60.00)
GLUCOSE: 93 mg/dL (ref 70–99)
Potassium: 4.4 mEq/L (ref 3.5–5.1)
Sodium: 138 mEq/L (ref 135–145)

## 2013-06-24 MED ORDER — LANSOPRAZOLE 30 MG PO CPDR: 30.0000 mg | DELAYED_RELEASE_CAPSULE | Freq: Two times a day (BID) | ORAL | Status: AC

## 2013-06-24 MED ORDER — FUROSEMIDE 20 MG PO TABS: 20.0000 mg | ORAL_TABLET | Freq: Every day | ORAL | Status: DC

## 2013-06-24 MED ORDER — ESOMEPRAZOLE MAGNESIUM 40 MG PO CPDR: 40.0000 mg | DELAYED_RELEASE_CAPSULE | Freq: Every day | ORAL | Status: DC

## 2013-06-24 NOTE — Telephone Encounter (Signed)
Do not see benazepril?

## 2013-06-24 NOTE — Telephone Encounter (Signed)
I called and left a message to advise the pt he can go to the lab at [facility] and have his labs drawn. His first date will be 07-01-2013 and go every Tuesday  Through 08-05-2013.  The orders are in so he can most likely go anytime during the day. If he wants to ask if there is a time he should go he can call them @ [phone].

## 2013-06-24 NOTE — Patient Instructions (Signed)
Please go to the basement level to have your labs drawn.  You have been scheduled for a colonoscopy with propofol. Please follow written instructions given to you at your visit today.  Please pick up your prep kit at the pharmacy within the next 1-3 days. If you use inhalers (even only as needed), please bring them with you on the day of your procedure.  We sent refills for Prevacid 30 mg and Lasix 20 mg to [facility] in [place].  We will call you about getting a lab at [facility] weekly for the next 6 weeks.

## 2013-06-24 NOTE — Addendum Note (Signed)
Addended byMarisue Humble K on: 06/24/2013 03:02 PM   Modules accepted: Orders

## 2013-06-24 NOTE — Progress Notes (Signed)
Reviewed and agree with management. Robert D. Kaplan, M.D., FACG  

## 2013-06-24 NOTE — Progress Notes (Signed)
Subjective:    Patient ID: Michael Bowers, male    DOB: 09/17/1945, 68 y.o.   MRN: [id number]  HPI [name] is a 68 year old Afro-American male new to our practice within the past month referred by Dr. [facility] for evaluation of hepatomegaly. Patient does have underlying history of coronary artery disease is status post CABG has hypertension and chronic renal insufficiency. He had presented with abdominal bloating and distention and underwent CT scan which showed an enlarged liver and spleen as well as ascites. 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 Acute and chronic hepatitis serologies are negative. Patient has no history of EtOH abuse. Patient has since undergone upper endoscopy with Dr. Deatra Ina which did not show any esophageal varices he does have evidence of portal gastropathy and also had a candida esophagitis. This is been treated with Diflucan and he was checked for HIV which was negative. Patient also underwent a diagnostic paracentesis cytology showed reactive changes only. Fluid was positive for WBCs 1047 6 segs were no organisms noted and cultures were negative. He did receive a one-week course of Cipro.  Tumor markers including CEA CA 19-9 and alpha-fetoprotein are all normal. INR slightly elevated at 1.6 and platelets of 141. Serum ferritin was 963. Patient comes back in today for followup with no specific complaints. He says his abdomen does not feel as distended and his weight has been stable. He does have some edema in his ankles bilaterally. His appetite has been good and his energy level has been stable.    Review of Systems  Constitutional: Negative.   HENT: Negative.   Eyes: Negative.   Respiratory: Negative.   Cardiovascular: Positive for leg swelling.  Gastrointestinal: Negative.   Endocrine: Negative.   Genitourinary: Negative.   Musculoskeletal: Negative.   Skin:  Negative.   Allergic/Immunologic: Negative.   Neurological: Negative.   Hematological: Negative.   Psychiatric/Behavioral: Negative.    Outpatient Prescriptions Prior to Visit  Medication Sig Dispense Refill  . amLODipine (NORVASC) 10 MG tablet TAKE ONE TABLET BY MOUTH ONE TIME DAILY  30 tablet  0  . aspirin 81 MG tablet Take 81 mg by mouth daily.      Marland Kitchen BENICAR 40 MG tablet TAKE ONE TABLET BY MOUTH ONE TIME DAILY  30 tablet  0  . metoprolol succinate (TOPROL-XL) 100 MG 24 hr tablet TAKE 1 TABLET BY MOUTH ONCE A DAY WITH OR IMMEDIATELY FOLLOWING A MEALAS INSTRUCTED  30 tablet  10  . lansoprazole (PREVACID) 30 MG capsule Take 1 capsule (30 mg total) by mouth daily at 12 noon.  28 capsule  0  . amoxicillin (AMOXIL) 500 MG capsule Take 1 capsule (500 mg total) by mouth 3 (three) times daily.  28 capsule  0  . ciprofloxacin (CIPRO) 500 MG tablet Take one tablet po BID x 1 week  14 tablet  0  . clarithromycin (BIAXIN) 500 MG tablet Take 1 tablet (500 mg total) by mouth 2 (two) times daily.  28 tablet  0  . fluconazole (DIFLUCAN) 100 MG tablet Take 1 tablet (100 mg total) by mouth daily.  10 tablet  0  . hydrochlorothiazide (HYDRODIURIL) 25 MG tablet TAKE ONE TABLET BY MOUTH ONE TIME DAILY  30 tablet  0  . omeprazole (PRILOSEC) 40 MG capsule Take 1 capsule (40 mg total) by mouth daily.  30 capsule  3   No facility-administered medications prior to visit.  No Known Allergies Patient Active Problem List   Diagnosis Date Noted  . Cirrhosis of liver without mention of alcohol 06/24/2013  . Portal hypertensive gastropathy 06/24/2013  . CAD (coronary artery disease) of artery bypass graft 01/03/2012  . Chronic constipation 08/17/2010  . Perianal lesion 08/17/2010  . Rectal bleed 08/17/2010  . Anemia 08/17/2010   History  Substance Use Topics  . Smoking status: Never Smoker   . Smokeless tobacco: Never Used  . Alcohol Use: No   family history includes Hypertension in his brother, brother,  brother, father, sister, sister, sister, sister, and sister. There is no history of Colon cancer, Colon polyps, or Liver disease.     Objective:   Physical Exam  well-developed older African American male in no acute distress, accompanied by his wife and daughter blood pressure 122/82 pulse 70 height 5 foot 7 weight 166. 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 He does have some evidence of decompensation with portal gastropathy and mild coagulopathy as well as ascites #2 colon neoplasia screening patient is scheduled for colonoscopy with Dr. Deatra Ina later this month #2 anemia #3 portal gastropathy #4 Candida esophagitis treated #5 retroperitoneal adenopathy on CT 05/29/2013 this may be reactive with cirrhosis but will need followup In addition had a heterogeneous bone marrow pattern of unclear significance.  Plan; repeat Bmet  and INR today Check an ANA, AMA, anti-smooth muscle antibody, and ceruloplasmin Continue Prevacid 30 mg by mouth every morning Start Lasix 20 mg by mouth every morning-he will need careful monitoring of his kidney function with very gentle diuresis. We will set him up for weekly BMET  through [facility]  family practice. Office followup with Dr. Deatra Ina after colonoscopy.  I Think he should have further imaging, probably with MRI of the abdomen and pelvis and will  review with Dr. Deatra Ina

## 2013-06-24 NOTE — Telephone Encounter (Signed)
Left message for patient to advise we sent Nexium 40 mg to [facility]

## 2013-06-25 ENCOUNTER — Telehealth: Payer: Self-pay | Admitting: Family Medicine

## 2013-06-25 LAB — ANA: Anti Nuclear Antibody(ANA): NEGATIVE

## 2013-06-25 LAB — MITOCHONDRIAL/SMOOTH MUSCLE AB PNL
Mitochondrial M2 Ab, IgG: 0.53 (ref <0.91)
Smooth Muscle Ab: 14 U (ref <20)

## 2013-06-25 NOTE — Telephone Encounter (Signed)
Patient was told  That he was not suppose to take those

## 2013-06-25 NOTE — Telephone Encounter (Signed)
Not on benazepril- is on amlodipine , metoprolol and benicar- please check with patient- should not be on benazapril and benicar.

## 2013-06-26 LAB — ALPHA-1-ANTITRYPSIN: A-1 Antitrypsin, Ser: 325 mg/dL — ABNORMAL HIGH (ref 83–199)

## 2013-06-26 LAB — CERULOPLASMIN: CERULOPLASMIN: 58 mg/dL — AB (ref 18–36)

## 2013-06-30 ENCOUNTER — Telehealth: Payer: Self-pay | Admitting: Physician Assistant

## 2013-06-30 DIAGNOSIS — K7460 Unspecified cirrhosis of liver: Secondary | ICD-10-CM

## 2013-06-30 NOTE — Telephone Encounter (Signed)
Attempted to reach the above phone number.  Got a message that says the voicemail box was not set up yet.  "thank you goodbye"

## 2013-07-01 ENCOUNTER — Other Ambulatory Visit (INDEPENDENT_AMBULATORY_CARE_PROVIDER_SITE_OTHER): Payer: Medicare Other

## 2013-07-01 ENCOUNTER — Telehealth: Payer: Self-pay | Admitting: Nurse Practitioner

## 2013-07-01 DIAGNOSIS — K7460 Unspecified cirrhosis of liver: Secondary | ICD-10-CM

## 2013-07-01 DIAGNOSIS — D649 Anemia, unspecified: Secondary | ICD-10-CM

## 2013-07-01 MED ORDER — AMLODIPINE BESYLATE 10 MG PO TABS: 10.0000 mg | ORAL_TABLET | Freq: Every day | ORAL | Status: DC

## 2013-07-01 NOTE — Telephone Encounter (Signed)
This is a maintenance medication and therefore he can't "finish taking it." I don't see that it was discontinued. Last refill was written on 05/19/13 and a visit was required for additional refills. He was seen on 05/23/13 but a new script was not written that day. I was unable to reach patient's daughter at the number provided.  I will send a new script with refills to the pharmacy.

## 2013-07-01 NOTE — Telephone Encounter (Signed)
Spoke with patient's daughter Michael Bowers and she is very concerned about her father. She states she saw him yesterday and he is "full of fluid." She states her father does not want her to call us and he states he is better than he was. She reports he looks pregnant and his feet are swollen also. She asks we do not call her father.

## 2013-07-01 NOTE — Telephone Encounter (Signed)
Results are not available.  Will call with results when they are

## 2013-07-01 NOTE — Telephone Encounter (Signed)
[place] started him on Lasix last week- he had a BMET done today through [employer]- we can call with results of that and see how he is doing

## 2013-07-01 NOTE — Progress Notes (Signed)
Pt came in for labs only for other doctor

## 2013-07-01 NOTE — Addendum Note (Signed)
Addended by: Earlene Plater on: 07/01/2013 08:26 AM   Modules accepted: Orders

## 2013-07-02 LAB — BMP8+EGFR
BUN/Creatinine Ratio: 10 (ref 10–22)
BUN: 21 mg/dL (ref 8–27)
CO2: 17 mmol/L — ABNORMAL LOW (ref 18–29)
Calcium: 9.1 mg/dL (ref 8.6–10.2)
Chloride: 106 mmol/L (ref 97–108)
Creatinine, Ser: 2.1 mg/dL — ABNORMAL HIGH (ref 0.76–1.27)
GFR calc Af Amer: 36 mL/min/{1.73_m2} — ABNORMAL LOW (ref >59)
GFR, EST NON AFRICAN AMERICAN: 31 mL/min/{1.73_m2} — AB (ref >59)
Glucose: 99 mg/dL (ref 65–99)
Potassium: 3.8 mmol/L (ref 3.5–5.2)
Sodium: 139 mmol/L (ref 134–144)

## 2013-07-02 NOTE — Telephone Encounter (Signed)
Left message for patient to call back  

## 2013-07-02 NOTE — Telephone Encounter (Signed)
Patient returned calls.  He reports that he is feeling better.  "fluid is going down".  He denies pain or SOB.  He is scheduled for a colonoscopy on 07/08/13.  He will have BMET drawn while he is here rather than [place] [facility].  See BMET results from yesterday.  REV scheduled for 08/20/13

## 2013-07-03 NOTE — Telephone Encounter (Signed)
They wanted to make sure you he was suppose to be on amlodipine and according to medication list he is supposed to be on amlodipine and that we refilled it on 4/7

## 2013-07-03 NOTE — Telephone Encounter (Signed)
Number called that was provided and received "i am sorry no vm not set up

## 2013-07-08 ENCOUNTER — Other Ambulatory Visit: Payer: Self-pay

## 2013-07-08 ENCOUNTER — Other Ambulatory Visit: Payer: Self-pay | Admitting: *Deleted

## 2013-07-08 ENCOUNTER — Ambulatory Visit (AMBULATORY_SURGERY_CENTER): Payer: Medicare Other | Admitting: Gastroenterology

## 2013-07-08 ENCOUNTER — Encounter: Payer: Self-pay | Admitting: Gastroenterology

## 2013-07-08 ENCOUNTER — Other Ambulatory Visit (INDEPENDENT_AMBULATORY_CARE_PROVIDER_SITE_OTHER): Payer: Medicare Other

## 2013-07-08 VITALS — BP 148/73 | HR 80 | Temp 96.4°F | Resp 24 | Ht 67.0 in | Wt 169.0 lb

## 2013-07-08 DIAGNOSIS — K7460 Unspecified cirrhosis of liver: Secondary | ICD-10-CM

## 2013-07-08 DIAGNOSIS — D649 Anemia, unspecified: Secondary | ICD-10-CM

## 2013-07-08 DIAGNOSIS — Z1211 Encounter for screening for malignant neoplasm of colon: Secondary | ICD-10-CM

## 2013-07-08 DIAGNOSIS — D126 Benign neoplasm of colon, unspecified: Secondary | ICD-10-CM

## 2013-07-08 DIAGNOSIS — R188 Other ascites: Secondary | ICD-10-CM

## 2013-07-08 LAB — BASIC METABOLIC PANEL
BUN: 26 mg/dL — ABNORMAL HIGH (ref 6–23)
CO2: 21 meq/L (ref 19–32)
Calcium: 9.2 mg/dL (ref 8.4–10.5)
Chloride: 109 mEq/L (ref 96–112)
Creatinine, Ser: 2.1 mg/dL — ABNORMAL HIGH (ref 0.4–1.5)
GFR: 41.57 mL/min — ABNORMAL LOW (ref >60.00)
Glucose, Bld: 108 mg/dL — ABNORMAL HIGH (ref 70–99)
Potassium: 4 mEq/L (ref 3.5–5.1)
SODIUM: 138 meq/L (ref 135–145)

## 2013-07-08 MED ORDER — SPIRONOLACTONE 50 MG PO TABS: 50.0000 mg | ORAL_TABLET | Freq: Two times a day (BID) | ORAL | Status: DC

## 2013-07-08 MED ORDER — SODIUM CHLORIDE 0.9 % IV SOLN: 500.0000 mL | INTRAVENOUS | Status: DC

## 2013-07-08 NOTE — Patient Instructions (Signed)
YOU HAD AN ENDOSCOPIC PROCEDURE TODAY AT THE  ENDOSCOPY CENTER: Refer to the procedure report that was given to you for any specific questions about what was found during the examination.  If the procedure report does not answer your questions, please call your gastroenterologist to clarify.  If you requested that your care partner not be given the details of your procedure findings, then the procedure report has been included in a sealed envelope for you to review at your convenience later.  YOU SHOULD EXPECT: Some feelings of bloating in the abdomen. Passage of more gas than usual.  Walking can help get rid of the air that was put into your GI tract during the procedure and reduce the bloating. If you had a lower endoscopy (such as a colonoscopy or flexible sigmoidoscopy) you may notice spotting of blood in your stool or on the toilet paper. If you underwent a bowel prep for your procedure, then you may not have a normal bowel movement for a few days.  DIET: Your first meal following the procedure should be a light meal and then it is ok to progress to your normal diet.  A half-sandwich or bowl of soup is an example of a good first meal.  Heavy or fried foods are harder to digest and may make you feel nauseous or bloated.  Likewise meals heavy in dairy and vegetables can cause extra gas to form and this can also increase the bloating.  Drink plenty of fluids but you should avoid alcoholic beverages for 24 hours.  ACTIVITY: Your care partner should take you home directly after the procedure.  You should plan to take it easy, moving slowly for the rest of the day.  You can resume normal activity the day after the procedure however you should NOT DRIVE or use heavy machinery for 24 hours (because of the sedation medicines used during the test).    SYMPTOMS TO REPORT IMMEDIATELY: A gastroenterologist can be reached at any hour.  During normal business hours, 8:30 AM to 5:00 PM Monday through Friday,  call [phone].  After hours and on weekends, please call the GI answering service at [phone] who will take a message and have the physician on call contact you.   Following lower endoscopy (colonoscopy or flexible sigmoidoscopy):  Excessive amounts of blood in the stool  Significant tenderness or worsening of abdominal pains  Swelling of the abdomen that is new, acute  Fever of 100F or higher  FOLLOW UP: If any biopsies were taken you will be contacted by phone or by letter within the next 1-3 weeks.  Call your gastroenterologist if you have not heard about the biopsies in 3 weeks.  Our staff will call the home number listed on your records the next business day following your procedure to check on you and address any questions or concerns that you may have at that time regarding the information given to you following your procedure. This is a courtesy call and so if there is no answer at the home number and we have not heard from you through the emergency physician on call, we will assume that you have returned to your regular daily activities without incident.  SIGNATURES/CONFIDENTIALITY: You and/or your care partner have signed paperwork which will be entered into your electronic medical record.  These signatures attest to the fact that that the information above on your After Visit Summary has been reviewed and is understood.  Full responsibility of the confidentiality of this   discharge information lies with you and/or your care-partner.  You need to go to the lab today for bloodwork.

## 2013-07-08 NOTE — Progress Notes (Signed)
Propofol given over incremental dosages 

## 2013-07-08 NOTE — Progress Notes (Signed)
Called to room to assist during endoscopic procedure.  Patient ID and intended procedure confirmed with present staff. Received instructions for my participation in the procedure from the performing physician.  

## 2013-07-08 NOTE — Op Note (Signed)
[place]  [address] [place], [address]   COLONOSCOPY PROCEDURE REPORT  PATIENT: Michael, Bowers  MR#: [id number] BIRTHDATE: 1946/01/13 , 67  yrs. old GENDER: Male ENDOSCOPIST: Inda Castle, MD REFERRED HK:[id number] Laurance Flatten, M.D. PROCEDURE DATE:  07/08/2013 PROCEDURE:   Colonoscopy with snare polypectomy and Colonoscopy with cold biopsy polypectomy First Screening Colonoscopy - Avg.  risk and is 50 yrs.  old or older Yes.  Prior Negative Screening - Now for repeat screening. N/A  History of Adenoma - Now for follow-up colonoscopy & has been > or = to 3 yrs.  N/A  Polyps Removed Today? Yes. ASA CLASS:   Class III INDICATIONS:Average risk patient for colon cancer. MEDICATIONS: MAC sedation, administered by CRNA and propofol (Diprivan) 100mg  IV  DESCRIPTION OF PROCEDURE:   After the risks benefits and alternatives of the procedure were thoroughly explained, informed consent was obtained.  A digital rectal exam revealed no abnormalities of the rectum.   The LB [id number]  endoscope was introduced through the anus and advanced to the cecum, which was identified by both the appendix and ileocecal valve. No adverse events experienced.   The quality of the prep was Suprep good  The instrument was then slowly withdrawn as the colon was fully examined.      COLON FINDINGS: Two small sessile polyps were found at the cecum measuring one and 3 mm, respectively.  There were removed with a biopsy forceps and cold polypectomy snare, respectively, and submitted to pathology.   A sessile polyp measuring 5 mm in size was found in the descending colon.  A polypectomy was performed with a cold snare.  The resection was complete and the polyp tissue was completely retrieved.   The colon was otherwise normal.  There was no diverticulosis, inflammation, polyps or cancers unless previously stated.  Retroflexed views revealed no abnormalities. The time to cecum=1  minutes 20 seconds.  Withdrawal time=8 minutes 50 seconds.  The scope was withdrawn and the procedure completed. COMPLICATIONS: There were no complications.  ENDOSCOPIC IMPRESSION: 1.   Two small sessile polyps were found at the cecum 2.   Sessile polyp measuring 5 mm in size was found in the descending colon; polypectomy was performed with a cold snare 3.   The colon was otherwise normal  RECOMMENDATIONS: If the polyp(s) removed today are proven to be adenomatous (pre-cancerous) polyps, you will need a repeat colonoscopy in 5 years.  Otherwise you should continue to follow colorectal cancer screening guidelines for "routine risk" patients with colonoscopy in 10 years.  You will receive a letter within 1-2 weeks with the results of your biopsy as well as final recommendations.  Please call my office if you have not received a letter after 3 weeks.   eSigned:  Inda Castle, MD 07/08/2013 3:54 PM   cc:   PATIENT NAME:  Michael, Bowers MR#: [id number]

## 2013-07-08 NOTE — Progress Notes (Addendum)
Dr. Deatra Ina called and would like a BMET on the patient before he starts him on another diuretic.  Vaughan Basta (his nurse on 3rd floor) was left a message to please put the order in the computer.

## 2013-07-09 ENCOUNTER — Telehealth: Payer: Self-pay | Admitting: *Deleted

## 2013-07-09 NOTE — Telephone Encounter (Signed)
  Follow up Call-  Call back number 07/08/2013 06/12/2013  Post procedure Call Back phone  # [phone]  Permission to leave phone message Yes Yes     Patient questions:  Do you have a fever, pain , or abdominal swelling? no Pain Score  1 *  Have you tolerated food without any problems? yes  Have you been able to return to your normal activities? yes  Do you have any questions about your discharge instructions: Diet   no Medications  no Follow up visit  no  Do you have questions or concerns about your Care? no  Actions: * If pain score is 4 or above: No action needed, pain <4.  Reminded pt. About follow up blood work in 3 weeks,pt. Verbalize understanding.

## 2013-07-11 ENCOUNTER — Telehealth: Payer: Self-pay | Admitting: Physician Assistant

## 2013-07-11 NOTE — Telephone Encounter (Signed)
Lets have him come for office appt early nex tweek and will decide if he needs a paracentesis-thanks

## 2013-07-11 NOTE — Telephone Encounter (Signed)
Spoke with patient's daughter and she states patient is having abdominal swelling and "he needs fluid removed." She reports his ankles and feet are swollen also. She does not know his current weight. She states he is SOB. Called patient and spoke with him. He states his abdomen is swollen all the time but is more swollen now. He states he is SOB but denies it is worse than usual. Please, advise.

## 2013-07-11 NOTE — Telephone Encounter (Signed)
Spoke with daughter and scheduled on 07/14/13 at 1:30 PM for OV.

## 2013-07-14 ENCOUNTER — Encounter: Payer: Self-pay | Admitting: Gastroenterology

## 2013-07-14 ENCOUNTER — Other Ambulatory Visit (INDEPENDENT_AMBULATORY_CARE_PROVIDER_SITE_OTHER): Payer: Medicare Other

## 2013-07-14 ENCOUNTER — Encounter: Payer: Self-pay | Admitting: Physician Assistant

## 2013-07-14 ENCOUNTER — Ambulatory Visit (INDEPENDENT_AMBULATORY_CARE_PROVIDER_SITE_OTHER): Payer: Medicare Other | Admitting: Physician Assistant

## 2013-07-14 VITALS — BP 122/80 | HR 78 | Ht 67.0 in | Wt 179.2 lb

## 2013-07-14 DIAGNOSIS — R188 Other ascites: Secondary | ICD-10-CM

## 2013-07-14 DIAGNOSIS — Z860100 Personal history of colon polyps, unspecified: Secondary | ICD-10-CM | POA: Insufficient documentation

## 2013-07-14 DIAGNOSIS — K7460 Unspecified cirrhosis of liver: Secondary | ICD-10-CM

## 2013-07-14 DIAGNOSIS — R197 Diarrhea, unspecified: Secondary | ICD-10-CM

## 2013-07-14 LAB — CBC WITH DIFFERENTIAL/PLATELET
Basophils Absolute: 0.1 10*3/uL (ref 0.0–0.1)
Basophils Relative: 0.5 % (ref 0.0–3.0)
EOS ABS: 0.1 10*3/uL (ref 0.0–0.7)
EOS PCT: 0.6 % (ref 0.0–5.0)
HCT: 31.1 % — ABNORMAL LOW (ref 39.0–52.0)
Hemoglobin: 10.1 g/dL — ABNORMAL LOW (ref 13.0–17.0)
Lymphocytes Relative: 8.7 % — ABNORMAL LOW (ref 12.0–46.0)
Lymphs Abs: 2 10*3/uL (ref 0.7–4.0)
MCHC: 32.3 g/dL (ref 30.0–36.0)
MCV: 86.5 fl (ref 78.0–100.0)
Monocytes Absolute: 6.6 10*3/uL — ABNORMAL HIGH (ref 0.1–1.0)
Monocytes Relative: 28.4 % — ABNORMAL HIGH (ref 3.0–12.0)
NEUTROS PCT: 61.8 % (ref 43.0–77.0)
Neutro Abs: 14.3 10*3/uL — ABNORMAL HIGH (ref 1.4–7.7)
Platelets: 157 10*3/uL (ref 150.0–400.0)
RBC: 3.6 Mil/uL — AB (ref 4.22–5.81)
RDW: 16.9 % — ABNORMAL HIGH (ref 11.5–14.6)

## 2013-07-14 LAB — BASIC METABOLIC PANEL
BUN: 33 mg/dL — ABNORMAL HIGH (ref 6–23)
CHLORIDE: 107 meq/L (ref 96–112)
CO2: 20 mEq/L (ref 19–32)
Calcium: 9.1 mg/dL (ref 8.4–10.5)
Creatinine, Ser: 2.2 mg/dL — ABNORMAL HIGH (ref 0.4–1.5)
GFR: 38.12 mL/min — ABNORMAL LOW (ref >60.00)
Glucose, Bld: 88 mg/dL (ref 70–99)
POTASSIUM: 3.9 meq/L (ref 3.5–5.1)
Sodium: 136 mEq/L (ref 135–145)

## 2013-07-14 NOTE — Progress Notes (Signed)
Subjective:    Patient ID: Michael Bowers, male    DOB: September 20, 1945, 68 y.o.   MRN: [id number]  HPI [name] is a pleasant 68 year old African American male known to Dr. Deatra Ina who has a fairly recent diagnosis of decompensated cirrhosis which at this point is labeled as cryptogenic. He has history of chronic kidney disease, coronary artery disease, and is status post CABG. Also with history of hypertension. He has had problems with volume overload and ascites and underwent an initial paracentesis in March of 2015. He has had fairly extensive workup with multiple serologies and hepatic markers all unremarkable. Most recent labs to include ANA alpha 1 antitrypsin level anti-smooth muscle antibody and a ceruloplasmin. The ceruloplasmin was slightly elevated at 58.  He has been started cautiously on diuretics and we have been following serial be metastases given his chronic renal insufficiency. He was started on Lasix at 20 mg by mouth daily at the time of his last office visit on 06/24/2013.  In the interim he underwent colonoscopy on 07/08/2013 was found to have 3 polyps and these were removed 2 of these were tubular adenomas.  Patient was also started on Aldactone at that time as he and his family had complained of increased edema and abdominal distention. He is now taking Aldactone 50 mg twice daily and Lasix 20 mg once daily.  He called stating that he has had increased in fluid in his abdomen which is making it uncomfortable for him to breathe. He also has been having some diarrhea since his colonoscopy. He has not been weighing himself regularly at home but says that he is following a very low-sodium diet. Despite this his weight is up 13 pounds since his last office visit.  He denies any abdominal pain but says he has been having 3-4 bowel movements a day and has noted some streaks of blood in his stool since his colonoscopy. He denies any grossly bloody bowel movements or melena.  Most recent  BMET  stable with creatinine in the 2 range   Review of Systems  Constitutional: Positive for unexpected weight change.  HENT: Negative.   Eyes: Negative.   Respiratory: Positive for shortness of breath.   Cardiovascular: Positive for leg swelling.  Gastrointestinal: Positive for diarrhea and abdominal distention.  Endocrine: Negative.   Genitourinary: Negative.   Musculoskeletal: Negative.   Skin: Negative.   Allergic/Immunologic: Negative.   Neurological: Negative.   Hematological: Negative.   Psychiatric/Behavioral: Negative.    Outpatient Prescriptions Prior to Visit  Medication Sig Dispense Refill  . amLODipine (NORVASC) 10 MG tablet Take 1 tablet (10 mg total) by mouth daily.  30 tablet  2  . aspirin 81 MG tablet Take 81 mg by mouth daily.      Marland Kitchen esomeprazole (NEXIUM) 40 MG capsule Take 1 capsule (40 mg total) by mouth daily at 12 noon.  30 capsule  1  . furosemide (LASIX) 20 MG tablet Take 1 tablet (20 mg total) by mouth daily.  30 tablet  1  . lansoprazole (PREVACID) 30 MG capsule Take 1 capsule (30 mg total) by mouth 2 (two) times daily before a meal.  60 capsule  2  . metoprolol succinate (TOPROL-XL) 100 MG 24 hr tablet TAKE 1 TABLET BY MOUTH ONCE A DAY WITH OR IMMEDIATELY FOLLOWING A MEALAS INSTRUCTED  30 tablet  10  . spironolactone (ALDACTONE) 50 MG tablet Take 1 tablet (50 mg total) by mouth 2 (two) times daily.  60 tablet  3  No facility-administered medications prior to visit.   No Known Allergies Patient Active Problem List   Diagnosis Date Noted  . Personal history of colonic polyps 07/14/2013  . Cirrhosis of liver without mention of alcohol 06/24/2013  . Portal hypertensive gastropathy 06/24/2013  . CAD (coronary artery disease) of artery bypass graft 01/03/2012  . Chronic constipation 08/17/2010  . Perianal lesion 08/17/2010  . Rectal bleed 08/17/2010  . Anemia 08/17/2010   History  Substance Use Topics  . Smoking status: Never Smoker   . Smokeless  tobacco: Never Used  . Alcohol Use: No   family history includes Hypertension in his brother, brother, brother, father, sister, sister, sister, sister, and sister. There is no history of Colon cancer, Colon polyps, or Liver disease.      Objective:   Physical Exam  well-developed older African American male in no acute distress accompanied by his wife and daughter blood pressure 122/80 pulse 78 height 5 foot 7 weight 179  Up  from 166 last office visit. HEENT; nontraumatic, normocephalic EOMI PERRLA sclera anicteric, Supple; no JVD, Cardiovascular; regular rate and rhythm with S1-S2 pulmonary clear bilaterally, Abdomen; large protuberant, large amount of ascites not tense, no palpable mass or hepatosplenomegaly bowel sounds are present, Rectal; exam not done, Extremities ;he has 2-3+ edema bilateral lower remedies up above his knees, Psych; mood and affect appropriate        Assessment & Plan:  #45  68 year old African American male with decompensated cirrhosis with anasarca and a 13 pound weight gain over the past 2 weeks despite starting diuretics.  #2 chronic kidney disease-baseline creatinine 2 #3 hypertension #4 portal gastropathy #5 adenomatous colon polyps-patient just had colonoscopy last week #5 mild diarrhea post colonoscopy #6 coronary artery disease  Plan; CBC with differential and BMET  today Continue 2 g sodium diet Continue Lasix at 20 mg by mouth daily and Aldactone 50 mg twice daily for now Schedule for ultrasound guided large volume paracentesis tomorrow-removed 6-7 L and send fluid for cell count Gram stain. Will replace with 50 g of 25% albumin Patient will use Imodium for now for diarrhea if this persists will need stool cultures We may need to gradually increase his diuretics but hesitant to do this too quickly given his chronic kidney disease. Will plan to see him back in the office in one week and repeat labs reassess weight etc. before increasing diuretics.

## 2013-07-14 NOTE — Patient Instructions (Addendum)
Please go to the basement level to have your labs drawn.   Go to [facility] hospital, Radiology department.Aarrive at 10:45 am.  This will be for the Paracentesis.    Take Imodium as needed for diarrhea.

## 2013-07-15 ENCOUNTER — Other Ambulatory Visit: Payer: Self-pay | Admitting: *Deleted

## 2013-07-15 ENCOUNTER — Ambulatory Visit (HOSPITAL_COMMUNITY)
Admission: RE | Admit: 2013-07-15 | Discharge: 2013-07-15 | Disposition: A | Discharge status: Home / Self Care | Payer: Medicare Other | Source: Ambulatory Visit | Attending: Physician Assistant | Admitting: Physician Assistant

## 2013-07-15 ENCOUNTER — Other Ambulatory Visit: Payer: Self-pay

## 2013-07-15 ENCOUNTER — Telehealth: Payer: Self-pay | Admitting: *Deleted

## 2013-07-15 VITALS — BP 108/59 | HR 67 | Temp 98.0°F | Resp 20

## 2013-07-15 DIAGNOSIS — R197 Diarrhea, unspecified: Secondary | ICD-10-CM

## 2013-07-15 DIAGNOSIS — R188 Other ascites: Secondary | ICD-10-CM

## 2013-07-15 LAB — GRAM STAIN

## 2013-07-15 LAB — BODY FLUID CELL COUNT WITH DIFFERENTIAL
Eos, Fluid: NONE SEEN %
LYMPHS FL: 26 %
Monocyte-Macrophage-Serous Fluid: 66 % (ref 50–90)
NEUTROPHIL FLUID: 8 % (ref 0–25)
WBC FLUID: 310 uL (ref 0–1000)

## 2013-07-15 MED ORDER — ALBUMIN HUMAN 25 % IV SOLN
50.0000 g | Freq: Once | INTRAVENOUS | Status: AC
  Administered 2013-07-15: 50 g via INTRAVENOUS
  Filled 2013-07-15: qty 200

## 2013-07-15 MED ORDER — METRONIDAZOLE 250 MG PO TABS: ORAL_TABLET | ORAL | Status: DC

## 2013-07-15 NOTE — Telephone Encounter (Signed)
Patient's daughter called back and asked if he can pick up specimen for stool at [facility]. Called and spoke with lab there and they want a faxed order and they can do this. Faxed order to [phone].[name] (daughter) aware.

## 2013-07-15 NOTE — Procedures (Signed)
US guided RLQ paracentesis 6.2 liters yellow fluid Sent for labs  Pt tolerated well

## 2013-07-16 ENCOUNTER — Other Ambulatory Visit (INDEPENDENT_AMBULATORY_CARE_PROVIDER_SITE_OTHER): Payer: Medicare Other

## 2013-07-16 ENCOUNTER — Other Ambulatory Visit: Payer: Self-pay | Admitting: *Deleted

## 2013-07-16 DIAGNOSIS — R188 Other ascites: Secondary | ICD-10-CM

## 2013-07-16 DIAGNOSIS — R197 Diarrhea, unspecified: Secondary | ICD-10-CM

## 2013-07-16 LAB — PATHOLOGIST SMEAR REVIEW: Path Review: REACTIVE

## 2013-07-16 MED ORDER — CIPROFLOXACIN HCL 500 MG PO TABS: ORAL_TABLET | ORAL | Status: DC

## 2013-07-16 NOTE — Progress Notes (Signed)
Reviewed and agree with management. Raul Torrance D. Tareva Leske, M.D., FACG  

## 2013-07-16 NOTE — Addendum Note (Signed)
Addended by: Earlene Plater on: 07/16/2013 08:38 AM   Modules accepted: Orders

## 2013-07-16 NOTE — Progress Notes (Signed)
Patient came in for labs only.

## 2013-07-16 NOTE — Addendum Note (Signed)
Addended by: Wyline Mood on: 07/16/2013 03:46 PM   Modules accepted: Orders

## 2013-07-17 LAB — CBC WITH DIFFERENTIAL
BASOS: 0 %
Basophils Absolute: 0 10*3/uL (ref 0.0–0.2)
EOS ABS: 0 10*3/uL (ref 0.0–0.4)
Eos: 0 %
HEMATOCRIT: 31 % — AB (ref 37.5–51.0)
HEMOGLOBIN: 9.9 g/dL — AB (ref 12.6–17.7)
LYMPHS: 14 %
Lymphocytes Absolute: 3.5 10*3/uL — ABNORMAL HIGH (ref 0.7–3.1)
MCH: 28 pg (ref 26.6–33.0)
MCHC: 31.9 g/dL (ref 31.5–35.7)
MCV: 88 fL (ref 79–97)
MONOCYTES: 27 %
Monocytes Absolute: 6.7 10*3/uL — ABNORMAL HIGH (ref 0.1–0.9)
NEUTROS ABS: 13.4 10*3/uL — AB (ref 1.4–7.0)
Neutrophils Relative %: 54 %
Platelets: 160 10*3/uL (ref 150–379)
RBC: 3.54 x10E6/uL — ABNORMAL LOW (ref 4.14–5.80)
RDW: 15.9 % — AB (ref 12.3–15.4)
WBC: 24.8 10*3/uL — AB (ref 3.4–10.8)
nRBC: 1 % — ABNORMAL HIGH (ref 0–0)

## 2013-07-17 LAB — BMP8+EGFR
BUN/Creatinine Ratio: 16 (ref 10–22)
BUN: 33 mg/dL — AB (ref 8–27)
CALCIUM: 9 mg/dL (ref 8.6–10.2)
CO2: 18 mmol/L (ref 18–29)
Chloride: 105 mmol/L (ref 97–108)
Creatinine, Ser: 2.04 mg/dL — ABNORMAL HIGH (ref 0.76–1.27)
GFR, EST AFRICAN AMERICAN: 37 mL/min/{1.73_m2} — AB (ref >59)
GFR, EST NON AFRICAN AMERICAN: 32 mL/min/{1.73_m2} — AB (ref >59)
Glucose: 110 mg/dL — ABNORMAL HIGH (ref 65–99)
POTASSIUM: 4.3 mmol/L (ref 3.5–5.2)
Sodium: 138 mmol/L (ref 134–144)

## 2013-07-17 LAB — IMMATURE CELLS
METAMYELOCYTES: 4 % — AB (ref 0–0)
MYELOCYTES: 1 % — AB (ref 0–0)

## 2013-07-18 ENCOUNTER — Telehealth: Payer: Self-pay | Admitting: *Deleted

## 2013-07-18 LAB — OVA AND PARASITE EXAMINATION

## 2013-07-18 LAB — CLOSTRIDIUM DIFFICILE BY PCR: CDIFFPCR: POSITIVE — AB

## 2013-07-18 LAB — FECAL OCCULT BLOOD, IMMUNOCHEMICAL: Fecal Occult Bld: NEGATIVE

## 2013-07-18 NOTE — Telephone Encounter (Signed)
Ok good- will see in office on Monday and 4/27

## 2013-07-18 NOTE — Telephone Encounter (Signed)
Message copied by Hulan Saas on Fri Jul 18, 2013  1:26 PM ------      Message from: [place], [place] S      Created: Fri Jul 18, 2013 12:31 PM       Please call pt this afyternoon and see how he is feeling-worried because his WBC is quite high-see if still having diarrhea- any other sxs? Abdominal pain, nausea ,fever etc/ and let me know-thanks- still don't have stool study results back ------

## 2013-07-18 NOTE — Telephone Encounter (Signed)
Spoke with patient's daughter [name] and she states the patient is doing good. No diarrhea, nausea, fever or abdominal pain.

## 2013-07-21 ENCOUNTER — Ambulatory Visit (INDEPENDENT_AMBULATORY_CARE_PROVIDER_SITE_OTHER): Payer: Medicare Other | Admitting: Physician Assistant

## 2013-07-21 ENCOUNTER — Other Ambulatory Visit (INDEPENDENT_AMBULATORY_CARE_PROVIDER_SITE_OTHER): Payer: Medicare Other

## 2013-07-21 ENCOUNTER — Other Ambulatory Visit: Payer: Self-pay

## 2013-07-21 ENCOUNTER — Telehealth: Payer: Self-pay | Admitting: *Deleted

## 2013-07-21 ENCOUNTER — Encounter: Payer: Self-pay | Admitting: Physician Assistant

## 2013-07-21 ENCOUNTER — Other Ambulatory Visit: Payer: Self-pay | Admitting: *Deleted

## 2013-07-21 ENCOUNTER — Ambulatory Visit: Payer: Medicare Other | Admitting: Physician Assistant

## 2013-07-21 VITALS — BP 128/72 | HR 82 | Ht 67.0 in | Wt 179.0 lb

## 2013-07-21 DIAGNOSIS — K7460 Unspecified cirrhosis of liver: Secondary | ICD-10-CM

## 2013-07-21 DIAGNOSIS — N189 Chronic kidney disease, unspecified: Secondary | ICD-10-CM

## 2013-07-21 DIAGNOSIS — R188 Other ascites: Secondary | ICD-10-CM

## 2013-07-21 DIAGNOSIS — A0472 Enterocolitis due to Clostridium difficile, not specified as recurrent: Secondary | ICD-10-CM

## 2013-07-21 DIAGNOSIS — A09 Infectious gastroenteritis and colitis, unspecified: Secondary | ICD-10-CM | POA: Insufficient documentation

## 2013-07-21 LAB — CBC WITH DIFFERENTIAL/PLATELET
BASOS ABS: 0.1 10*3/uL (ref 0.0–0.1)
Basophils Relative: 0.3 % (ref 0.0–3.0)
EOS PCT: 0.8 % (ref 0.0–5.0)
Eosinophils Absolute: 0.2 10*3/uL (ref 0.0–0.7)
HCT: 31.7 % — ABNORMAL LOW (ref 39.0–52.0)
Hemoglobin: 10.3 g/dL — ABNORMAL LOW (ref 13.0–17.0)
LYMPHS PCT: 8 % — AB (ref 12.0–46.0)
Lymphs Abs: 2.1 10*3/uL (ref 0.7–4.0)
MCHC: 32.3 g/dL (ref 30.0–36.0)
MCV: 88.1 fl (ref 78.0–100.0)
Monocytes Absolute: 6.8 10*3/uL — ABNORMAL HIGH (ref 0.1–1.0)
Monocytes Relative: 25.7 % — ABNORMAL HIGH (ref 3.0–12.0)
Neutro Abs: 17.1 10*3/uL — ABNORMAL HIGH (ref 1.4–7.7)
Neutrophils Relative %: 65.2 % (ref 43.0–77.0)
Platelets: 119 10*3/uL — ABNORMAL LOW (ref 150.0–400.0)
RBC: 3.6 Mil/uL — AB (ref 4.22–5.81)
RDW: 18 % — AB (ref 11.5–14.6)
WBC: 26.3 10*3/uL (ref 4.5–10.5)

## 2013-07-21 LAB — STOOL CULTURE: E coli, Shiga toxin Assay: NEGATIVE

## 2013-07-21 LAB — BASIC METABOLIC PANEL
BUN: 41 mg/dL — ABNORMAL HIGH (ref 6–23)
CHLORIDE: 107 meq/L (ref 96–112)
CO2: 21 mEq/L (ref 19–32)
Calcium: 8.8 mg/dL (ref 8.4–10.5)
Creatinine, Ser: 2.6 mg/dL — ABNORMAL HIGH (ref 0.4–1.5)
GFR: 31.6 mL/min — ABNORMAL LOW (ref >60.00)
GLUCOSE: 94 mg/dL (ref 70–99)
Potassium: 4.5 mEq/L (ref 3.5–5.1)
Sodium: 134 mEq/L — ABNORMAL LOW (ref 135–145)

## 2013-07-21 MED ORDER — FIRST-VANCOMYCIN 50 50 MG/ML PO SOLN: 5.0000 mL | Freq: Four times a day (QID) | ORAL | Status: DC

## 2013-07-21 MED ORDER — METRONIDAZOLE 250 MG PO TABS: ORAL_TABLET | ORAL | Status: DC

## 2013-07-21 NOTE — Patient Instructions (Addendum)
Please go to the basement level to have your labs drawn.  We have given you samples of FLorastor. Take 1 tab twice daily.  We made you an appointment with Dr. Deatra Ina for 09-16-2013 at 9:45 am.  If you need to be seen sooner, Amy Esterwood PA will be glad to see you.   We sent another 7 more days of the Flagyl 250 mg, take 1 tab 4 times daily . ( this will make the total of the Flagyl 17 days total ).   The Ultrasound Paracentesis is scheduled for Thurs 07-24-2013 at [place] to patient registration , arrive at 9:45 am for a 10 am appointment.

## 2013-07-21 NOTE — Telephone Encounter (Signed)
Spoke to [place] and his daughter , [name].  I told him there is a medication Amy Esterwood PA-C thinks he should have.  His white blood cell count was high and his C-Diff test result is positive.  I advised [name] that there is a pharmacy, [facility] on [address], [place]. [facility] ) There is a new form of liquid Grape flavored Vancomycin ( First Vancomycin 50 ) and the cash price for 300 ml is $139.00 plus tax.  This price is for 250 ml 4 times daily for 14 days.  [name] and her father said they can afford the price and will pick this prescription on Thurs 07-24-2012 the day of his paracentesis at [place] said for him to take the Flagyl 250 mg 4 times daily until he picks up the Vancomycin prescription on Thursday 4-30  and he is to start that new prescription.

## 2013-07-21 NOTE — Progress Notes (Signed)
Subjective:    Patient ID: Michael Bowers, male    DOB: [birth year], 68 y.o.   MRN: [id number]  HPI  Michael Bowers is a pleasant 68 year old African American male known to Dr. Deatra Ina who has been seen several times by myself recently for decompensated cirrhosis with ascites and chronic kidney disease. He was last seen about a week ago and at that time had any increase in ascites with increased shortness of breath. He underwent a paracentesis and had 6.2 L removed. He says he felt much better after that. He did have cell counts done showing WBC of 310 with neutrophils 8 and Gram stain showed no organisms. At that time he was also complaining of diarrhea for the previous week since he at undergone colonoscopy with Dr. Deatra Ina on the 14th. He was having 4-5 liquid bowel movements per day which was new for him. He had no complaints of abdominal pain cramping nausea vomiting fever chills etc.  Unfortunately on repeat labs is to be [facility] was noted to be 23,000 hemoglobin 10 hematocrit of 31.1 platelets 157 BUN is 33 and creatinine stable at 2.2. Stool pathogen panel was ordered and this returned positive for C. difficile on Friday 424. We had placed him empirically on metronidazole and also Cipro while awaiting the paracentesis. He was asked to stop the Cipro and continue the metronidazole this weekend.  He comes back in today stating he feels okay he has been eating without any difficulty again- no fever, or chills, no abdominal pain, but continues to have 4-5 liquid bowel movements per day. He is interested in having another paracentesis as he has reaccumulated most of the fluid in his abdomen no he says is not as tight as it was previously. His daughter also asked about him being set up for these on a regular basis.  We have been conservative with his diuretics given his chronic renal insufficiency. He is on Lasix 20 mg by mouth daily and Aldactone 100 mg daily.    Review of Systems  Constitutional: Positive for  appetite change and fatigue.  HENT: Negative.   Eyes: Negative.   Respiratory: Negative.   Cardiovascular: Positive for leg swelling.  Gastrointestinal: Positive for diarrhea and abdominal distention.  Endocrine: Negative.   Genitourinary: Negative.   Musculoskeletal: Negative.   Skin: Negative.   Allergic/Immunologic: Negative.   Neurological: Negative.   Hematological: Negative.   Psychiatric/Behavioral: Negative.    Outpatient Prescriptions Prior to Visit  Medication Sig Dispense Refill  . amLODipine (NORVASC) 10 MG tablet Take 1 tablet (10 mg total) by mouth daily.  30 tablet  2  . aspirin 81 MG tablet Take 81 mg by mouth daily.      Marland Kitchen esomeprazole (NEXIUM) 40 MG capsule Take 1 capsule (40 mg total) by mouth daily at 12 noon.  30 capsule  1  . furosemide (LASIX) 20 MG tablet Take 1 tablet (20 mg total) by mouth daily.  30 tablet  1  . lansoprazole (PREVACID) 30 MG capsule Take 1 capsule (30 mg total) by mouth 2 (two) times daily before a meal.  60 capsule  2  . metoprolol succinate (TOPROL-XL) 100 MG 24 hr tablet TAKE 1 TABLET BY MOUTH ONCE A DAY WITH OR IMMEDIATELY FOLLOWING A MEALAS INSTRUCTED  30 tablet  10  . spironolactone (ALDACTONE) 50 MG tablet Take 1 tablet (50 mg total) by mouth 2 (two) times daily.  60 tablet  3  . ciprofloxacin (CIPRO) 500 MG tablet Take one po BID  x 7 days  14 tablet  0  . metroNIDAZOLE (FLAGYL) 250 MG tablet Take one po QID x 10 days  40 tablet  0   No facility-administered medications prior to visit.   No Known Allergies Patient Active Problem List   Diagnosis Date Noted  . Ascites 07/21/2013  . Chronic kidney disease 07/21/2013  . Colitis, infectious 07/21/2013  . Personal history of colonic polyps 07/14/2013  . Cirrhosis of liver without mention of alcohol 06/24/2013  . Portal hypertensive gastropathy 06/24/2013  . CAD (coronary artery disease) of artery bypass graft 01/03/2012  . Chronic constipation 08/17/2010  . Rectal bleed 08/17/2010    . Anemia 08/17/2010     History  Substance Use Topics  . Smoking status: Never Smoker   . Smokeless tobacco: Never Used  . Alcohol Use: No   family history includes Hypertension in his brother, brother, brother, father, sister, sister, sister, sister, and sister. There is no history of Colon cancer, Colon polyps, or Liver disease.     Objective:   Physical Exam well-developed older African American male in no acute distress accompanied by his family brought pressure 120/72 pulse 82 height 5 foot 7 weight 179 which is stable. HEENT; nontraumatic normocephalic EOMI PERRLA sclera anicteric, Supple; no JVD, Cardiovascular ;regular rate and rhythm with S1-S2 no murmur or gallop, Pulmonary; clear bilaterally, Abdomen; large protuberant but nontender ascites no palpable mass or hepatosplenomegaly nontender bowel sounds present, Rectal ;exam not done, Extremities; 2+ edema to the knees bilaterally, Psych ;mood and affect appropriate        Assessment & Plan:  #70  68 year old African American male with decompensated cirrhosis etiology not clear probably underlying NAFLD.Marland Kitchen Primary problem has been anasarca. Patient status post therapeutic paracentesis 1 week ago and fluid has reaccumulated #2 C. difficile colitis-on day #5 metronidazole with continued mild to moderate diarrhea.  #3 leukocytosis secondary to above #4 chronic kidney disease #5 history of colon polyps-last colonoscopy April 2015 tubular adenomas removed #6 candida esophagitis treated #7 screening for esophageal varices EGD 3 2015 no varices noted does have portal gastropathy  Plan; CBC and be met today, if WBC is not decreasing will switch to vancomycin 254 times daily x14 days. Otherwise would continue a 14-21 day course of metronidazole for the C. Difficile Will schedule for therapeutic paracentesis with removal of 6-7 L of fluid later this week, and replace with 50 g of albumin IV Continue Lasix 20 mg by mouth daily and  Aldactone 100 mg by mouth daily until labs reviewed would like to push diuretics if creatinine is stable. Patient will followup with Dr. Deatra Ina in 3-4 weeks and I will follow him in the interim as needed

## 2013-07-22 ENCOUNTER — Other Ambulatory Visit: Payer: Self-pay | Admitting: *Deleted

## 2013-07-22 ENCOUNTER — Telehealth: Payer: Self-pay | Admitting: Physician Assistant

## 2013-07-22 DIAGNOSIS — K7460 Unspecified cirrhosis of liver: Secondary | ICD-10-CM

## 2013-07-22 DIAGNOSIS — R197 Diarrhea, unspecified: Secondary | ICD-10-CM

## 2013-07-22 DIAGNOSIS — R109 Unspecified abdominal pain: Secondary | ICD-10-CM

## 2013-07-22 NOTE — Telephone Encounter (Signed)
I spoke to [place] the patients daughter and per Nicoletta Ba PA-C at this time we are not doing a referral to a kidney specialist.  Everything is related at this time to cirrhosis. If hia lab numbers worsen, and we feel he needs sent to a kidney specialist we certainly will do that.  I also told her to bring her Dad to [facility] at 9:30 for a 2 view x-ray before the paracentesis. I told her Dr. Deatra Ina wanted him to have the x -ray.She asked me if I could call [place] at the [facility] to have the prescription for the Vancomycin today. I told her that I would be glad to do that. I did call and they will get the prescription ready today and it was $112. 00 instead of $139.00.  [name] thanked me for taking care of this.

## 2013-07-22 NOTE — Progress Notes (Signed)
Reviewed and agree with management. Lexiana Spindel D. Sani Madariaga, M.D., FACG  

## 2013-07-24 ENCOUNTER — Other Ambulatory Visit (INDEPENDENT_AMBULATORY_CARE_PROVIDER_SITE_OTHER): Payer: Medicare Other

## 2013-07-24 ENCOUNTER — Other Ambulatory Visit: Payer: Self-pay

## 2013-07-24 ENCOUNTER — Ambulatory Visit (HOSPITAL_COMMUNITY)
Admission: RE | Admit: 2013-07-24 | Discharge: 2013-07-24 | Disposition: A | Discharge status: Home / Self Care | Payer: Medicare Other | Source: Ambulatory Visit | Attending: Physician Assistant | Admitting: Physician Assistant

## 2013-07-24 ENCOUNTER — Encounter (HOSPITAL_COMMUNITY)
Admission: RE | Admit: 2013-07-24 | Discharge: 2013-07-24 | Disposition: A | Discharge status: Home / Self Care | Payer: Medicare Other | Source: Ambulatory Visit | Attending: Physician Assistant | Admitting: Physician Assistant

## 2013-07-24 DIAGNOSIS — R188 Other ascites: Secondary | ICD-10-CM

## 2013-07-24 DIAGNOSIS — R109 Unspecified abdominal pain: Secondary | ICD-10-CM

## 2013-07-24 DIAGNOSIS — K7460 Unspecified cirrhosis of liver: Secondary | ICD-10-CM

## 2013-07-24 DIAGNOSIS — A0472 Enterocolitis due to Clostridium difficile, not specified as recurrent: Secondary | ICD-10-CM

## 2013-07-24 DIAGNOSIS — R197 Diarrhea, unspecified: Secondary | ICD-10-CM

## 2013-07-24 LAB — CBC WITH DIFFERENTIAL/PLATELET
BASOS ABS: 0 10*3/uL (ref 0.0–0.1)
BASOS PCT: 0.2 % (ref 0.0–3.0)
EOS ABS: 0.2 10*3/uL (ref 0.0–0.7)
Eosinophils Relative: 0.7 % (ref 0.0–5.0)
HCT: 30.8 % — ABNORMAL LOW (ref 39.0–52.0)
HEMOGLOBIN: 10.1 g/dL — AB (ref 13.0–17.0)
Lymphocytes Relative: 8 % — ABNORMAL LOW (ref 12.0–46.0)
Lymphs Abs: 2 10*3/uL (ref 0.7–4.0)
MCHC: 32.8 g/dL (ref 30.0–36.0)
MCV: 87.9 fl (ref 78.0–100.0)
Monocytes Absolute: 7.4 10*3/uL — ABNORMAL HIGH (ref 0.1–1.0)
Monocytes Relative: 29.9 % — ABNORMAL HIGH (ref 3.0–12.0)
NEUTROS ABS: 15.2 10*3/uL — AB (ref 1.4–7.7)
Neutrophils Relative %: 61.2 % (ref 43.0–77.0)
Platelets: 109 10*3/uL — ABNORMAL LOW (ref 150.0–400.0)
RBC: 3.5 Mil/uL — AB (ref 4.22–5.81)
RDW: 17.8 % — AB (ref 11.5–14.6)
WBC: 24.9 10*3/uL (ref 4.5–10.5)

## 2013-07-24 LAB — BASIC METABOLIC PANEL
BUN: 44 mg/dL — AB (ref 6–23)
CO2: 20 mEq/L (ref 19–32)
CREATININE: 2.6 mg/dL — AB (ref 0.4–1.5)
Calcium: 8.6 mg/dL (ref 8.4–10.5)
Chloride: 109 mEq/L (ref 96–112)
GFR: 32.02 mL/min — AB (ref >60.00)
Glucose, Bld: 112 mg/dL — ABNORMAL HIGH (ref 70–99)
Potassium: 4.8 mEq/L (ref 3.5–5.1)
Sodium: 134 mEq/L — ABNORMAL LOW (ref 135–145)

## 2013-07-24 MED ORDER — ALBUMIN HUMAN 25 % IV SOLN
50.0000 g | Freq: Once | INTRAVENOUS | Status: AC
  Administered 2013-07-24: 50 g via INTRAVENOUS
  Filled 2013-07-24: qty 200

## 2013-07-24 MED ORDER — SODIUM CHLORIDE 0.9 % IV SOLN
Freq: Once | INTRAVENOUS | Status: AC
  Administered 2013-07-24: 250 mL via INTRAVENOUS

## 2013-07-24 NOTE — Discharge Instructions (Signed)
Albumin injection °What is this medicine? °ALBUMIN (al BYOO min) is used to treat or prevent shock following serious injury, bleeding, surgery, or burns by increasing the volume of blood plasma. This medicine can also replace low blood protein. °This medicine may be used for other purposes; ask your health care provider or pharmacist if you have questions. °COMMON BRAND NAME(S): Albuked , Albumarc, Albuminar, Albutein, Buminate, Flexbumin, Kedbumin, Macrotec, Plasbumin °What should I tell my health care provider before I take this medicine? °They need to know if you have any of the following conditions: °-anemia °-heart disease °-kidney disease °-an unusual or allergic reaction to albumin, other medicines, foods, dyes, or preservatives °-pregnant or trying to get pregnant °-breast-feeding °How should I use this medicine? °This medicine is for infusion into a vein. It is given by a health-care professional in a hospital or clinic. °Talk to your pediatrician regarding the use of this medicine in children. While this drug may be prescribed for selected conditions, precautions do apply. °Overdosage: If you think you have taken too much of this medicine contact a poison control center or emergency room at once. °NOTE: This medicine is only for you. Do not share this medicine with others. °What if I miss a dose? °This does not apply. °What may interact with this medicine? °Interactions are not expected. °This list may not describe all possible interactions. Give your health care provider a list of all the medicines, herbs, non-prescription drugs, or dietary supplements you use. Also tell them if you smoke, drink alcohol, or use illegal drugs. Some items may interact with your medicine. °What should I watch for while using this medicine? °Your condition will be closely monitored while you receive this medicine. °Some products are derived from human plasma, and there is a small risk that these products may contain certain  types of virus or bacteria. All products are processed to kill most viruses and bacteria. If you have questions concerning the risk of infections, discuss them with your doctor or health care professional. °What side effects may I notice from receiving this medicine? °Side effects that you should report to your doctor or health care professional as soon as possible: °-allergic reactions like skin rash, itching or hives, swelling of the face, lips, or tongue °-breathing problems °-changes in heartbeat °-fever, chills °-pain, redness or swelling at the injection site °-signs of viral infection including fever, drowsiness, chills, runny nose followed in about 2 weeks by a rash and joint pain °-tightness in the chest °Side effects that usually do not require medical attention (report to your doctor or health care professional if they continue or are bothersome): °-increased salivation °-nausea, vomiting °This list may not describe all possible side effects. Call your doctor for medical advice about side effects. You may report side effects to FDA at 1-800-FDA-1088. °Where should I keep my medicine? °This does not apply. You will not be given this medicine to store at home. °NOTE: This sheet is a summary. It may not cover all possible information. If you have questions about this medicine, talk to your doctor, pharmacist, or health care provider. °© 2014, [employer]vier/[employer] Standard. (2007-06-06 10:18:55) ° °

## 2013-07-24 NOTE — Procedures (Signed)
Successful US guided paracentesis from [facility].  Yielded 6.5 liters  of clear yellow fluid.  No immediate complications.  Pt tolerated well.   Specimen was not sent for labs.  Ascencion Dike PA-C 07/24/2013 10:51 AM

## 2013-07-28 ENCOUNTER — Other Ambulatory Visit (INDEPENDENT_AMBULATORY_CARE_PROVIDER_SITE_OTHER): Payer: Medicare Other

## 2013-07-28 ENCOUNTER — Encounter: Payer: Self-pay | Admitting: Physician Assistant

## 2013-07-28 ENCOUNTER — Ambulatory Visit (INDEPENDENT_AMBULATORY_CARE_PROVIDER_SITE_OTHER): Payer: Medicare Other | Admitting: Physician Assistant

## 2013-07-28 VITALS — BP 104/64 | HR 72 | Ht 67.0 in | Wt 175.0 lb

## 2013-07-28 DIAGNOSIS — R188 Other ascites: Secondary | ICD-10-CM

## 2013-07-28 DIAGNOSIS — K7460 Unspecified cirrhosis of liver: Secondary | ICD-10-CM

## 2013-07-28 DIAGNOSIS — N289 Disorder of kidney and ureter, unspecified: Secondary | ICD-10-CM

## 2013-07-28 DIAGNOSIS — N189 Chronic kidney disease, unspecified: Secondary | ICD-10-CM

## 2013-07-28 DIAGNOSIS — A09 Infectious gastroenteritis and colitis, unspecified: Secondary | ICD-10-CM

## 2013-07-28 LAB — CBC WITH DIFFERENTIAL/PLATELET
Basophils Absolute: 0.1 10*3/uL (ref 0.0–0.1)
Basophils Absolute: 0.5 10*3/uL — ABNORMAL HIGH (ref 0.0–0.1)
Basophils Relative: 0.3 % (ref 0.0–3.0)
Basophils Relative: 2.5 % (ref 0.0–3.0)
EOS PCT: 0.9 % (ref 0.0–5.0)
EOS PCT: 0.6 % (ref 0.0–5.0)
Eosinophils Absolute: 0.2 10*3/uL (ref 0.0–0.7)
Eosinophils Absolute: 0.1 10*3/uL (ref 0.0–0.7)
HCT: 32.9 % — ABNORMAL LOW (ref 39.0–52.0)
HCT: 33.4 % — ABNORMAL LOW (ref 39.0–52.0)
HEMOGLOBIN: 10.9 g/dL — AB (ref 13.0–17.0)
Hemoglobin: 10.8 g/dL — ABNORMAL LOW (ref 13.0–17.0)
LYMPHS PCT: 10.9 % — AB (ref 12.0–46.0)
Lymphocytes Relative: 9.1 % — ABNORMAL LOW (ref 12.0–46.0)
Lymphs Abs: 2 10*3/uL (ref 0.7–4.0)
Lymphs Abs: 2.4 10*3/uL (ref 0.7–4.0)
MCHC: 32.7 g/dL (ref 30.0–36.0)
MCHC: 32.6 g/dL (ref 30.0–36.0)
MCV: 88.2 fl (ref 78.0–100.0)
MCV: 88.1 fl (ref 78.0–100.0)
MONO ABS: 6 10*3/uL — AB (ref 0.1–1.0)
MONOS PCT: 23.9 % — AB (ref 3.0–12.0)
Monocytes Absolute: 5.2 10*3/uL — ABNORMAL HIGH (ref 0.1–1.0)
NEUTROS ABS: 13.5 10*3/uL — AB (ref 1.4–7.7)
NEUTROS PCT: 61.9 % (ref 43.0–77.0)
Neutro Abs: 13.4 10*3/uL — ABNORMAL HIGH (ref 1.4–7.7)
Neutrophils Relative %: 62.1 % (ref 43.0–77.0)
Platelets: 99 10*3/uL — ABNORMAL LOW (ref 150.0–400.0)
Platelets: 102 10*3/uL — ABNORMAL LOW (ref 150.0–400.0)
RBC: 3.73 Mil/uL — ABNORMAL LOW (ref 4.22–5.81)
RBC: 3.8 Mil/uL — ABNORMAL LOW (ref 4.22–5.81)
RDW: 17.5 % — ABNORMAL HIGH (ref 11.5–14.6)
RDW: 17.7 % — ABNORMAL HIGH (ref 11.5–14.6)

## 2013-07-28 LAB — URINALYSIS, ROUTINE W REFLEX MICROSCOPIC
Hgb urine dipstick: NEGATIVE
Ketones, ur: NEGATIVE
Leukocytes, UA: NEGATIVE
NITRITE: NEGATIVE
Specific Gravity, Urine: >=1.03 — AB (ref 1.000–1.030)
TOTAL PROTEIN, URINE-UPE24: 30 — AB
URINE GLUCOSE: NEGATIVE
UROBILINOGEN UA: 0.2 (ref 0.0–1.0)
pH: 5.5 (ref 5.0–8.0)

## 2013-07-28 LAB — COMPREHENSIVE METABOLIC PANEL
ALBUMIN: 2.6 g/dL — AB (ref 3.5–5.2)
ALK PHOS: 653 U/L — AB (ref 39–117)
ALT: 14 U/L (ref 0–53)
ALT: 15 U/L (ref 0–53)
AST: 22 U/L (ref 0–37)
AST: 23 U/L (ref 0–37)
Albumin: 2.7 g/dL — ABNORMAL LOW (ref 3.5–5.2)
Alkaline Phosphatase: 657 U/L — ABNORMAL HIGH (ref 39–117)
BUN: 57 mg/dL — ABNORMAL HIGH (ref 6–23)
BUN: 58 mg/dL — ABNORMAL HIGH (ref 6–23)
CALCIUM: 8.6 mg/dL (ref 8.4–10.5)
CHLORIDE: 108 meq/L (ref 96–112)
CHLORIDE: 107 meq/L (ref 96–112)
CO2: 17 meq/L — AB (ref 19–32)
CO2: 17 meq/L — AB (ref 19–32)
Calcium: 8.6 mg/dL (ref 8.4–10.5)
Creatinine, Ser: 3.5 mg/dL — ABNORMAL HIGH (ref 0.4–1.5)
Creatinine, Ser: 3.7 mg/dL — ABNORMAL HIGH (ref 0.4–1.5)
GFR: 22.27 mL/min — ABNORMAL LOW (ref >60.00)
GFR: 21.16 mL/min — AB (ref >60.00)
GLUCOSE: 100 mg/dL — AB (ref 70–99)
Glucose, Bld: 132 mg/dL — ABNORMAL HIGH (ref 70–99)
POTASSIUM: 4.9 meq/L (ref 3.5–5.1)
Potassium: 4.7 mEq/L (ref 3.5–5.1)
Sodium: 132 mEq/L — ABNORMAL LOW (ref 135–145)
Sodium: 131 mEq/L — ABNORMAL LOW (ref 135–145)
TOTAL PROTEIN: 7.1 g/dL (ref 6.0–8.3)
Total Bilirubin: 1.1 mg/dL (ref 0.3–1.2)
Total Bilirubin: 0.8 mg/dL (ref 0.2–1.2)
Total Protein: 7.2 g/dL (ref 6.0–8.3)

## 2013-07-28 LAB — PROTIME-INR
INR: 1.4 ratio — ABNORMAL HIGH (ref 0.8–1.0)
PROTHROMBIN TIME: 15.7 s — AB (ref 9.6–13.1)

## 2013-07-28 NOTE — Progress Notes (Signed)
Subjective:    Patient ID: Michael Bowers, male    DOB: 06-18-1945, 68 y.o.   MRN: [id number]  HPI Zyad comes back in today for followup, he has been seen very frequently over the past few weeks for decompensated cirrhosis with ascites and chronic renal insufficiency. He developed diarrhea about a week and a half ago and tested C. differential positive. He is now on vancomycin 254 times daily over the past 5-1/2 days. We have also had difficulty with his ascites and diuretic management.  Patient underwent a paracentesis on Thursday, 07/24/2013 and had 6 L removed, he was given albumin 50 g postprocedure. His diuretics were stopped on 07/23/2013 due to climb in creatinine. His creatinine was 2.6 at that time. He has also had a markedly elevated white count when last checked 24,000. Despite that he has looked fairly well. He has no complaints of abdominal pain nausea vomiting fever or chills. He is having 4-5 loose stools per day. He states that he has felt okay over the past 3 or 4 days with no changes in his status. He does have a lot of edema in his lower remedies as well. Labs were drawn today after patient soft this visit and have returned showing a WBC of 21,000 which is improved, hemoglobin 10 stable, sodium 131, potassium 4.7 CO2 of 17 BUN 57 creatinine now 3.5, LFTs are normal albumin 2.7, INR is 1.4 protime 15.7    .  Review of Systems  Constitutional: Positive for fatigue.  HENT: Negative.   Eyes: Negative.   Respiratory: Negative.   Cardiovascular: Positive for leg swelling.  Gastrointestinal: Positive for diarrhea and abdominal distention.  Endocrine: Negative.   Genitourinary: Negative.   Musculoskeletal: Negative.   Skin: Negative.   Allergic/Immunologic: Negative.   Neurological: Negative.   Hematological: Negative.   Psychiatric/Behavioral: Negative.    Outpatient Prescriptions Prior to Visit  Medication Sig Dispense Refill  . amLODipine (NORVASC) 10 MG tablet Take 1  tablet (10 mg total) by mouth daily.  30 tablet  2  . aspirin 81 MG tablet Take 81 mg by mouth daily.      Marland Kitchen esomeprazole (NEXIUM) 40 MG capsule Take 1 capsule (40 mg total) by mouth daily at 12 noon.  30 capsule  1  . lansoprazole (PREVACID) 30 MG capsule Take 1 capsule (30 mg total) by mouth 2 (two) times daily before a meal.  60 capsule  2  . metoprolol succinate (TOPROL-XL) 100 MG 24 hr tablet TAKE 1 TABLET BY MOUTH ONCE A DAY WITH OR IMMEDIATELY FOLLOWING A MEALAS INSTRUCTED  30 tablet  10  . metroNIDAZOLE (FLAGYL) 250 MG tablet Take one po QID x 10 days  28 tablet  0  . Vancomycin HCl (FIRST-VANCOMYCIN 50) 50 MG/ML SOLN Take 5 mLs by mouth 4 (four) times daily.  280 mL  0  . furosemide (LASIX) 20 MG tablet Take 1 tablet (20 mg total) by mouth daily.  30 tablet  1  . spironolactone (ALDACTONE) 50 MG tablet Take 1 tablet (50 mg total) by mouth 2 (two) times daily.  60 tablet  3   No facility-administered medications prior to visit.   No Known Allergies Patient Active Problem List   Diagnosis Date Noted  . Ascites 07/21/2013  . Chronic kidney disease 07/21/2013  . Colitis, infectious 07/21/2013  . Personal history of colonic polyps 07/14/2013  . Cirrhosis of liver without mention of alcohol 06/24/2013  . Portal hypertensive gastropathy 06/24/2013  . CAD (coronary  artery disease) of artery bypass graft 01/03/2012  . Chronic constipation 08/17/2010  . Rectal bleed 08/17/2010  . Anemia 08/17/2010   History  Substance Use Topics  . Smoking status: Never Smoker   . Smokeless tobacco: Never Used  . Alcohol Use: No   family history includes Hypertension in his brother, brother, brother, father, sister, sister, sister, sister, and sister. There is no history of Colon cancer, Colon polyps, or Liver disease.     Objective:   Physical Exam  And and and and and male in no acute distress accompanied by his family blood pressure 104/64 pulse 72 height 5 foot 7 weight 174 down 4 pounds since  last office visit. HEENT; nontraumatic normocephalic EOMI PERRLA sclera anicteric, Supple; no JVD, Cardiovascular; regular rate and rhythm with S1-S2 no murmur or gallop, Pulmonary; clear bilaterally, Abdomen; large for further and positive fluid wave but nontender ascites nontender bowel sounds are active there is no palpable mass or hepatosplenomegaly, Rectal ;exam not done, Extremities 2+ edema to the knees bilaterally, Psych; mood and affect appropriate       Assessment & Plan:   #58  68 year old [place] American male with decompensated cirrhosis, cryptogenic with refractory ascites/anasarca  #2 C. difficile colitis -day #6 vancomycin orally. Patient appears stable nontoxic and white count has minimally improved . #3 chronic renal insufficiency with worsening renal function now despite holding diuretics over the past 5 days.  #4 coronary artery disease status post CABG  #5 portal hypertensive gastropathy   Plan ; continue to hold diuretics  No further paracenteses until renal function improves  Continue 2 g sodium diet  Continue vancomycin 250 mg 4 times daily times at least 14 days  Continue Florastor  twice daily  Check urine electrolytes today and urine osmolality  Renal consult  Repeat labs in 48 hours and patient will be reassessed in the office at that time as well, if he has any further decline in renal function he will require admission. Case was discussed with Dr. Deatra Ina today and patient was also seen by Dr. Deatra Ina during this office visit.

## 2013-07-28 NOTE — Patient Instructions (Signed)
Please go to the basement level to have your labs drawn.  We made you an appointment to see Amy on Wed 07-30-2013 at 2:30 PM.  Come to the lab before coming to the 3rd floor.  We will call you once we make an appoinatment with [facility].

## 2013-07-29 ENCOUNTER — Other Ambulatory Visit: Payer: Self-pay | Admitting: *Deleted

## 2013-07-29 DIAGNOSIS — K7460 Unspecified cirrhosis of liver: Secondary | ICD-10-CM

## 2013-07-29 LAB — POTASSIUM, URINE, RANDOM: POTASSIUM UR: 43 meq/L

## 2013-07-29 LAB — SODIUM, URINE, RANDOM: Sodium, Ur: <15 mEq/L

## 2013-07-29 NOTE — Progress Notes (Signed)
Reviewed and agree with management. Joriel Streety D. Drue Camera, M.D., FACG  

## 2013-07-30 ENCOUNTER — Inpatient Hospital Stay (HOSPITAL_COMMUNITY): Payer: Medicare Other

## 2013-07-30 ENCOUNTER — Encounter: Payer: Self-pay | Admitting: Physician Assistant

## 2013-07-30 ENCOUNTER — Ambulatory Visit (INDEPENDENT_AMBULATORY_CARE_PROVIDER_SITE_OTHER): Payer: Self-pay | Admitting: Physician Assistant

## 2013-07-30 ENCOUNTER — Ambulatory Visit: Payer: Medicare Other | Admitting: Physician Assistant

## 2013-07-30 ENCOUNTER — Inpatient Hospital Stay (HOSPITAL_COMMUNITY)
Admission: AD | Admit: 2013-07-30 | Discharge: 2013-08-09 | DRG: 432 | Disposition: A | Discharge status: Hospice | Payer: Medicare Other | Source: Ambulatory Visit | Attending: Internal Medicine | Admitting: Internal Medicine

## 2013-07-30 ENCOUNTER — Encounter (HOSPITAL_COMMUNITY): Payer: Self-pay | Admitting: General Practice

## 2013-07-30 ENCOUNTER — Other Ambulatory Visit (INDEPENDENT_AMBULATORY_CARE_PROVIDER_SITE_OTHER): Payer: Medicare Other

## 2013-07-30 VITALS — BP 94/64 | HR 80 | Ht 65.25 in | Wt 177.0 lb

## 2013-07-30 DIAGNOSIS — D689 Coagulation defect, unspecified: Secondary | ICD-10-CM | POA: Diagnosis present

## 2013-07-30 DIAGNOSIS — E869 Volume depletion, unspecified: Secondary | ICD-10-CM | POA: Diagnosis present

## 2013-07-30 DIAGNOSIS — D649 Anemia, unspecified: Secondary | ICD-10-CM | POA: Diagnosis present

## 2013-07-30 DIAGNOSIS — N179 Acute kidney failure, unspecified: Secondary | ICD-10-CM | POA: Diagnosis present

## 2013-07-30 DIAGNOSIS — I129 Hypertensive chronic kidney disease with stage 1 through stage 4 chronic kidney disease, or unspecified chronic kidney disease: Secondary | ICD-10-CM | POA: Diagnosis present

## 2013-07-30 DIAGNOSIS — I25810 Atherosclerosis of coronary artery bypass graft(s) without angina pectoris: Secondary | ICD-10-CM

## 2013-07-30 DIAGNOSIS — E871 Hypo-osmolality and hyponatremia: Secondary | ICD-10-CM | POA: Diagnosis present

## 2013-07-30 DIAGNOSIS — N189 Chronic kidney disease, unspecified: Secondary | ICD-10-CM

## 2013-07-30 DIAGNOSIS — K7460 Unspecified cirrhosis of liver: Secondary | ICD-10-CM

## 2013-07-30 DIAGNOSIS — R188 Other ascites: Secondary | ICD-10-CM | POA: Diagnosis present

## 2013-07-30 DIAGNOSIS — Z66 Do not resuscitate: Secondary | ICD-10-CM | POA: Diagnosis not present

## 2013-07-30 DIAGNOSIS — Z515 Encounter for palliative care: Secondary | ICD-10-CM

## 2013-07-30 DIAGNOSIS — N183 Chronic kidney disease, stage 3 unspecified: Secondary | ICD-10-CM | POA: Diagnosis present

## 2013-07-30 DIAGNOSIS — K766 Portal hypertension: Secondary | ICD-10-CM | POA: Diagnosis present

## 2013-07-30 DIAGNOSIS — K767 Hepatorenal syndrome: Secondary | ICD-10-CM | POA: Diagnosis present

## 2013-07-30 DIAGNOSIS — I2510 Atherosclerotic heart disease of native coronary artery without angina pectoris: Secondary | ICD-10-CM | POA: Diagnosis present

## 2013-07-30 DIAGNOSIS — E872 Acidosis, unspecified: Secondary | ICD-10-CM | POA: Diagnosis not present

## 2013-07-30 DIAGNOSIS — K7290 Hepatic failure, unspecified without coma: Secondary | ICD-10-CM | POA: Diagnosis present

## 2013-07-30 DIAGNOSIS — D72829 Elevated white blood cell count, unspecified: Secondary | ICD-10-CM | POA: Diagnosis present

## 2013-07-30 DIAGNOSIS — K769 Liver disease, unspecified: Secondary | ICD-10-CM

## 2013-07-30 DIAGNOSIS — D696 Thrombocytopenia, unspecified: Secondary | ICD-10-CM | POA: Diagnosis present

## 2013-07-30 DIAGNOSIS — K3189 Other diseases of stomach and duodenum: Secondary | ICD-10-CM

## 2013-07-30 DIAGNOSIS — Z79899 Other long term (current) drug therapy: Secondary | ICD-10-CM

## 2013-07-30 DIAGNOSIS — Z7982 Long term (current) use of aspirin: Secondary | ICD-10-CM

## 2013-07-30 DIAGNOSIS — A0472 Enterocolitis due to Clostridium difficile, not specified as recurrent: Secondary | ICD-10-CM

## 2013-07-30 DIAGNOSIS — R339 Retention of urine, unspecified: Secondary | ICD-10-CM | POA: Diagnosis not present

## 2013-07-30 DIAGNOSIS — Z9861 Coronary angioplasty status: Secondary | ICD-10-CM

## 2013-07-30 HISTORY — DX: Other ascites: R18.8

## 2013-07-30 HISTORY — DX: Gastro-esophageal reflux disease without esophagitis: K21.9

## 2013-07-30 HISTORY — DX: Hepatomegaly, not elsewhere classified: R16.0

## 2013-07-30 LAB — CBC WITH DIFFERENTIAL/PLATELET
BASOS ABS: 0.3 10*3/uL — AB (ref 0.0–0.1)
BASOS PCT: 1.4 % (ref 0.0–3.0)
EOS PCT: 0.8 % (ref 0.0–5.0)
Eosinophils Absolute: 0.2 10*3/uL (ref 0.0–0.7)
HEMATOCRIT: 30.7 % — AB (ref 39.0–52.0)
HEMOGLOBIN: 10.1 g/dL — AB (ref 13.0–17.0)
LYMPHS ABS: 1.6 10*3/uL (ref 0.7–4.0)
Lymphocytes Relative: 7.2 % — ABNORMAL LOW (ref 12.0–46.0)
MCHC: 32.9 g/dL (ref 30.0–36.0)
MCV: 87.9 fl (ref 78.0–100.0)
Monocytes Absolute: 7 10*3/uL — ABNORMAL HIGH (ref 0.1–1.0)
Monocytes Relative: 30.5 % — ABNORMAL HIGH (ref 3.0–12.0)
NEUTROS ABS: 13.7 10*3/uL — AB (ref 1.4–7.7)
Neutrophils Relative %: 60.1 % (ref 43.0–77.0)
Platelets: 89 10*3/uL — ABNORMAL LOW (ref 150.0–400.0)
RBC: 3.49 Mil/uL — ABNORMAL LOW (ref 4.22–5.81)
RDW: 17.5 % — ABNORMAL HIGH (ref 11.5–15.5)

## 2013-07-30 LAB — BASIC METABOLIC PANEL
BUN: 62 mg/dL — AB (ref 6–23)
CALCIUM: 8.6 mg/dL (ref 8.4–10.5)
CO2: 16 meq/L — AB (ref 19–32)
Chloride: 108 mEq/L (ref 96–112)
Creatinine, Ser: 3.6 mg/dL — ABNORMAL HIGH (ref 0.4–1.5)
GFR: 22.06 mL/min — ABNORMAL LOW (ref >60.00)
GLUCOSE: 126 mg/dL — AB (ref 70–99)
Potassium: 5 mEq/L (ref 3.5–5.1)
Sodium: 132 mEq/L — ABNORMAL LOW (ref 135–145)

## 2013-07-30 LAB — PROTIME-INR
INR: 1.34 (ref 0.00–1.49)
PROTHROMBIN TIME: 16.3 s — AB (ref 11.6–15.2)

## 2013-07-30 MED ORDER — PANTOPRAZOLE SODIUM 40 MG PO TBEC
40.0000 mg | DELAYED_RELEASE_TABLET | Freq: Every day | ORAL | Status: DC
  Administered 2013-07-30 – 2013-07-31 (×2): 40 mg via ORAL
  Filled 2013-07-30 (×2): qty 1

## 2013-07-30 MED ORDER — OCTREOTIDE ACETATE 50 MCG/ML IJ SOLN
50.0000 ug | Freq: Three times a day (TID) | INTRAMUSCULAR | Status: DC
  Administered 2013-07-30 – 2013-08-09 (×28): 50 ug via SUBCUTANEOUS
  Filled 2013-07-30 (×38): qty 1

## 2013-07-30 MED ORDER — ALBUMIN HUMAN 25 % IV SOLN
50.0000 g | Freq: Four times a day (QID) | INTRAVENOUS | Status: AC
  Administered 2013-07-30 – 2013-08-01 (×6): 50 g via INTRAVENOUS
  Filled 2013-07-30 (×8): qty 200

## 2013-07-30 MED ORDER — ONDANSETRON HCL 4 MG/2ML IJ SOLN: 4.0000 mg | Freq: Four times a day (QID) | INTRAMUSCULAR | Status: DC | PRN

## 2013-07-30 MED ORDER — VANCOMYCIN 50 MG/ML ORAL SOLUTION
250.0000 mg | Freq: Four times a day (QID) | ORAL | Status: DC
  Administered 2013-07-30 – 2013-08-09 (×38): 250 mg via ORAL
  Filled 2013-07-30 (×43): qty 5

## 2013-07-30 MED ORDER — HYDROCODONE-ACETAMINOPHEN 5-325 MG PO TABS
1.0000 | ORAL_TABLET | ORAL | Status: DC | PRN
  Administered 2013-08-05 – 2013-08-09 (×8): 2 via ORAL
  Filled 2013-07-30 (×8): qty 2

## 2013-07-30 MED ORDER — ALBUMIN HUMAN 25 % IV SOLN
50.0000 g | Freq: Once | INTRAVENOUS | Status: DC
  Filled 2013-07-30: qty 200

## 2013-07-30 MED ORDER — SODIUM CHLORIDE 0.9 % IV BOLUS (SEPSIS)
1000.0000 mL | Freq: Once | INTRAVENOUS | Status: AC
  Administered 2013-07-30: 1000 mL via INTRAVENOUS

## 2013-07-30 MED ORDER — MIDODRINE HCL 5 MG PO TABS
10.0000 mg | ORAL_TABLET | Freq: Three times a day (TID) | ORAL | Status: DC
  Administered 2013-07-30 – 2013-08-05 (×17): 10 mg via ORAL
  Administered 2013-08-05: 5 mg via ORAL
  Administered 2013-08-05 – 2013-08-09 (×10): 10 mg via ORAL
  Filled 2013-07-30 (×34): qty 2

## 2013-07-30 MED ORDER — ACETAMINOPHEN 325 MG PO TABS: 650.0000 mg | ORAL_TABLET | Freq: Four times a day (QID) | ORAL | Status: DC | PRN

## 2013-07-30 MED ORDER — ACETAMINOPHEN 650 MG RE SUPP: 650.0000 mg | Freq: Four times a day (QID) | RECTAL | Status: DC | PRN

## 2013-07-30 MED ORDER — ONDANSETRON HCL 4 MG PO TABS: 4.0000 mg | ORAL_TABLET | Freq: Four times a day (QID) | ORAL | Status: DC | PRN

## 2013-07-30 MED ORDER — SODIUM CHLORIDE 0.9 % IV BOLUS (SEPSIS): 250.0000 mL | Freq: Once | INTRAVENOUS | Status: DC

## 2013-07-30 MED ORDER — ADULT MULTIVITAMIN W/MINERALS CH
1.0000 | ORAL_TABLET | Freq: Every day | ORAL | Status: DC
  Administered 2013-07-30 – 2013-08-09 (×10): 1 via ORAL
  Filled 2013-07-30 (×11): qty 1

## 2013-07-30 NOTE — H&P (Signed)
Admission Note  Primary Care Physician:  Redge Gainer, MD Primary Gastroenterologist:  Dr. Deatra Ina  HPI: Michael Bowers is a 68 y.o. male known recently to Dr. Deatra Ina, [facility] was [facility] in mid March of 2015 for evaluation of hepatomegaly. Patient has history of coronary artery disease he status post CABG and has history of chronic renal insufficiency with baseline creatinine around 1.8 as well as hypertension. Patient's primary complaint was abdominal discomfort and distention. 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 He had significant ascites and volume overload and has undergone several paracenteses over the past month and a half for refractory ascites. Patient had EGD done 06/12/2013 finding of portal hypertensive gastropathy and probable Candida esophagitis no definite varices. He was then also scheduled for colonoscopy this was done on 07/08/2013. He had 3 small polyps which were removed. Patient developed diarrhea after the colonoscopy and has since been diagnosed with C. difficile colitis. He was initially started on a course of metronidazole and after 4-5 days of treatment had no change in his symptoms and was switched to vancomycin last week. He has been on 2 and 50 mg 4 times daily over the past week. He continues with diarrhea having 4-5 loose to liquid stools per day which been nonbloody. He has had no complaints of abdominal pain or cramping no fever or chills no nausea or vomiting and has been eating without difficulty.  Patient had been started on low-dose diuretics initially with Lasix 20 mg daily and then added Aldactone 50 mg daily. He had a gradual rise in his creatinine and both diuretics were stopped 1 week ago. His creatinine was 2.6 at that time. Due to marked ascites which makes him quite uncomfortable and short of breath he did undergo a  paracentesis last week on 07/23/2013 with removal of about 6 L of fluid.-No cell counts were sent with that specimen.  Patient was seen earlier this week in the office with Dr. Deatra Ina WBC was 21,000 which was down slightly and creatinine was 3.5. Hospitalization was discussed but decision made to reevaluate in 48 hours. He comes back today stat labs were done and WBC is now up to 22.8, hemoglobin 10.1 hematocrit of 30.7 platelets 89,000 sodium 132 CO2 16 BUN 62 and creatinine of 3.6 urine electrolytes had been drawn on the fourth and resulted on the fifth with urine sodium of less than 15.  Patient feels about the same no specific complaints today weight is stable and diarrhea continues with about 4 bowel movements per day no complaints of abdominal pain cramping nausea fever etc. Patient was hoping to have a horseshoe tournament at his house this weekend. 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 difficile colitis   Past Medical History  Diagnosis Date  . HTN (hypertension)   . CAD (coronary artery disease)     [name];  [name], no ischemia, [name]  . CKD (chronic kidney disease)   . ED (erectile dysfunction)   . Colon polyps   . Cirrhosis     cryptogennic    Past Surgical History  Procedure Laterality Date  . Angioplasty         . Colonoscopy  10/2007    Dr. Amedeo Plenty, inflammatory polyp    Prior  to Admission medications   Medication Sig Start Date End Date Taking? Authorizing Provider  amLODipine (NORVASC) 10 MG tablet Take 1 tablet (10 mg total) by mouth daily. 07/01/13  Yes Mary-Margaret Hassell Done, FNP  aspirin 81 MG tablet Take 81 mg by mouth daily.   Yes Historical Provider, MD  esomeprazole (NEXIUM) 40 MG capsule Take 1 capsule (40 mg total) by mouth daily at 12 noon. 06/24/13  Yes [name], PA-C  lansoprazole (PREVACID) 30 MG  capsule Take 1 capsule (30 mg total) by mouth 2 (two) times daily before a meal. 06/24/13  Yes Katie Faraone S Bobetta Korf, PA-C  metoprolol succinate (TOPROL-XL) 100 MG 24 hr tablet TAKE 1 TABLET BY MOUTH ONCE A DAY WITH OR IMMEDIATELY FOLLOWING A MEALAS INSTRUCTED 01/15/13  Yes Minus Breeding, MD  Vancomycin HCl (FIRST-VANCOMYCIN 50) 50 MG/ML SOLN Take 5 mLs by mouth 4 (four) times daily. 07/21/13  Yes Tyren Dugar S Whitman Meinhardt, PA-C    No current facility-administered medications for this visit.   No current outpatient prescriptions on file.   Facility-Administered Medications Ordered in Other Visits  Medication Dose Route Frequency Provider Last Rate Last Dose  . acetaminophen (TYLENOL) tablet 650 mg  650 mg Oral Q6H PRN Derrian Poli S Ajiah Mcglinn, PA-C       Or  . acetaminophen (TYLENOL) suppository 650 mg  650 mg Rectal Q6H PRN Letanya Froh S Rylyn Ranganathan, PA-C      . HYDROcodone-acetaminophen (NORCO/VICODIN) 5-325 MG per tablet 1-2 tablet  1-2 tablet Oral Q4H PRN Artisha Capri S Ellarae Nevitt, PA-C      . midodrine (PROAMATINE) tablet 10 mg  10 mg Oral TID WC Rillie Riffel S Tashi Andujo, PA-C      . multivitamin with minerals tablet 1 tablet  1 tablet Oral Daily Carter Kassel S Jacqeline Broers, PA-C      . octreotide (SANDOSTATIN) injection 50 mcg  50 mcg Subcutaneous 3 times per day Breeann Reposa S Newton Frutiger, PA-C      . ondansetron (ZOFRAN) tablet 4 mg  4 mg Oral Q6H PRN Ayiana Winslett S Ambrie Carte, PA-C       Or  . ondansetron (ZOFRAN) injection 4 mg  4 mg Intravenous Q6H PRN Rosalene Wardrop S Kensi Karr, PA-C      . pantoprazole (PROTONIX) EC tablet 40 mg  40 mg Oral Daily Laurice Iglesia S Kennidee Heyne, PA-C      . sodium chloride 0.9 % bolus 1,000 mL  1,000 mL Intravenous Once Rashanda Magloire S Izea Livolsi, PA-C      . vancomycin (VANCOCIN) 50 mg/mL oral solution 250 mg  250 mg Oral 4 times per day Clarise Chacko S Arrietty Dercole, PA-C        Allergies as of 07/30/2013  . (No Known Allergies)    Family History  Problem Relation Age of Onset  . Colon cancer Neg Hx   . Colon polyps Neg Hx   . Liver disease Neg Hx   . Hypertension Father    . Hypertension Sister   . Hypertension Brother   . Hypertension Sister   . Hypertension Sister   . Hypertension Sister   . Hypertension Sister   . Hypertension Brother   . Hypertension Brother     History   Social History  . Marital Status: Widowed    Spouse Name: N/A    Number of Children: 48  . Years of Education: N/A   Occupational History  . retired    Social History Main Topics  . Smoking status: Never Smoker   . Smokeless tobacco: Never Used  . Alcohol Use: No  .  Drug Use: No  . Sexual Activity: Not on file   Other Topics Concern  . Not on file   Social History Narrative   Divorced and lives with girlfriend.    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     Daily caffeine                    Review of Systems:  All systems reviewed an negative except where noted in HPI.    Physical Exam: Vital signs in last 24 hours: @VSRANGES @   General:  Pleasant, well-developed,AA male in NAD accompanied by daughter and wife. Blood pressure 94/64 pulse 80 height 5 foot 5 weight 177 Head:  Normocephalic and atraumatic. Eyes:  Sclera clear, no icterus.   Conjunctiva pink. Ears:  Normal auditory acuity. Mouth:  No deformity or lesions.  Neck:  Supple; no masses . Lungs:  Clear throughout to auscultation.   No wheezes, crackles, or rhonchi. No acute distress. Heart:  Regular rate and rhythm; no murmurs. Abdomen: Large, fairly tense ascites nontender bowel sounds are present no palpable mass or hepatosplenomegaly Rectal:  Not done  Msk:  Symmetrical without gross deformities.. Pulses:  Normal pulses noted. Extremities:  3+ edema to the knees bilaterally Neurologic:  Alert and  oriented x4;  grossly normal neurologically. Skin:  Intact without significant lesions or rashes. Cervical Nodes:  No significant cervical adenopathy. Psych:  Alert and cooperative. Normal mood and affect.  Lab Results:  Recent Labs  07/28/13 1101 07/28/13 1428 07/30/13 1434  WBC 21.7  Repeated and verified X2.* 21.7 cH* 22.8 Repeated and verified X2.*  HGB 10.8* 10.9* 10.1*  HCT 32.9* 33.4* 30.7*  PLT 99.0* 102.0* 89.0*   BMET  Recent Labs  07/28/13 1101 07/28/13 1428 07/30/13 1434  NA 132* 131* 132*  K 4.9 4.7 5.0  CL 108 107 108  CO2 17* 17* 16*  GLUCOSE 100* 132* 126*  BUN 57* 58* 62*  CREATININE 3.5* 3.7* 3.6*  CALCIUM 8.6 8.6 8.6   LFT  Recent Labs  07/28/13 1428  PROT 7.2  ALBUMIN 2.7*  AST 23  ALT 15  ALKPHOS 657*  BILITOT 0.8   PT/INR  Recent Labs  07/28/13 1428  LABPROT 15.7*  INR 1.4*     Studies/Results: No results found.  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 8 hours. Will await further input from nephrology.     @RRHLOS @  Deshay Kirstein S Yon Schiffman  07/30/2013, 4:47 PM

## 2013-07-30 NOTE — Progress Notes (Signed)
Pt received 50 gm of Albumin tolerated well vital signs charted in epic. Arthor Captain LPN

## 2013-07-30 NOTE — Patient Instructions (Signed)
You are to go to [place], off of [address], [place].  Go to Entrance A, [facility] for admitting.  You will be on the 6th floor, [facility].

## 2013-07-30 NOTE — Progress Notes (Signed)
Patient ID: Michael Bowers, male   DOB: 04/24/1945, 68 y.o.   MRN: [id number]   Patient was seen in the office this afternoon he has been followed over the past couple of weeks regularly with diagnosis of C. difficile colitis made last week. Patient has been on Vancomycin  250 mg 4 times daily over the past week. 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 He had been getting fairly regular paracenteses. Pt has diagnosis of chronic kidney disease and over the past couple of weeks creatinine has been rising. We had been trying to gently diuresis him, however Aldactone and Lasix were both discontinued 1 week ago,due to rise in creat of 2.6 He did undergo a paracentesis on 4/30 2015, for tense ascites. WBC has been hanging above 20,000 and this has been attributed to C. difficile. He had been nontoxic appearing, non-febrile, with no complaints of abdominal pain ,and was having 4-5 bowel movements per day.  Brought back to the office today and labs were repeated stat and unfortunately despite being off diuretics creatinine is now 3.6, BUN 62, sodium 132 potassium 5 CO2 of 16., WBC is 22.8, hemoglobin 10.1 hematocrit of 30.7 platelets 89,000 last INR done 2 days ago prothrombin time 15.7, INR of 1.4 urine electrolytes were done on 54 and resulted yesterday with the urine sodium of less than 15. Patient feels about the same, eating without difficulty no complaints of abdominal pain continues with 4 loose stools per day. Weight is stable at 177. He does have some exertional dyspnea, no fever chills, and was hoping to host a horsehoe t ournamaent at his house this weekend. 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 He will be admitted to [facility]- will initiate protocol for HRS ,and obtain renal consultation. Hold his blood pressure meds. For details please see the admission H&P and orders.

## 2013-07-30 NOTE — Consult Note (Signed)
Michael Bowers is an 68 y.o. male referred by Dr Deatra Ina   Chief Complaint: Acute on Chronic renal failure HPI: 68yo BM with recent diagnosis of presumably cryptogenic cirrhosis admitted with worsening renal fx post 6L paracentesis on 07/24/13 in midst of diarrhea from C diff for past 2 weeks.  Pt has known CKD of ? Etiology (possibly HTN) as he was told his renal fx was abnormal by his PCP 7 yrs ago.  He has never seen a nephrologist.  He does not know his baseline Scr but in 7/14 scr 2.04.  In 07/14/13 Scr 2.2 and on 07/15/13 underwent 6.2L paracentesis and Scr increased to 2.6 on 07/24/13.  On 07/24/13 he underwent another paracentesis of 6.5L and Scr increased to 3.5 on 07/28/13 and today Scr 3.6.  In the midst of this he has been having 4-6 watery stools/d for last 2 weeks.  He has been on spironolactone and amlodipine.  SBP upper 90's to low 100's on admission.  He has not been on an ACE/ARB and has not taken ant NSAID's.  [place] ABD in 3/15 showed Rt kid 11.1cm, Lt 12 Cm no hydro no mass.  UNa < 15.  He has just been placed on midodrine and octreotide.  Past Medical History  Diagnosis Date  . HTN (hypertension)   . CAD (coronary artery disease)     [name];  [name], no ischemia, [name]  . CKD (chronic kidney disease)   . ED (erectile dysfunction)   . Colon polyps   . Cirrhosis     cryptogennic  . Hepatomegaly   . Ascites     /notes 07/30/2013    Past Surgical History  Procedure Laterality Date  . Colonoscopy  10/2007    Dr. Amedeo Plenty, inflammatory polyp  . Paracentesis      "several"/notes 07/30/2013  . Coronary angioplasty with stent placement  02/2000    1 stent/notes 02/28/2000    Family History  Problem Relation Age of Onset  . Colon cancer Neg Hx   . Colon polyps Neg Hx   . Liver disease Neg Hx   . Hypertension Father   . Hypertension Sister   . Hypertension Brother   . Hypertension Sister    . Hypertension Sister   . Hypertension Sister   . Hypertension Sister   . Hypertension Brother   . Hypertension Brother   No Family Hx renal disease  Social History:  reports that he has never smoked. He has never used smokeless tobacco. He reports that he does not drink alcohol or use illicit drugs.  Lives with girlfriend  Allergies: No Known Allergies  Medications Prior to Admission  Medication Sig Dispense Refill  . amLODipine (NORVASC) 10 MG tablet Take 1 tablet (10 mg total) by mouth daily.  30 tablet  2  . aspirin 81 MG tablet Take 81 mg by mouth daily.      [place] Kitchen esomeprazole (NEXIUM) 40 MG capsule Take 1 capsule (40 mg total) by mouth daily at 12 noon.  30 capsule  1  . hydrochlorothiazide (HYDRODIURIL) 25 MG tablet Take 25 mg by mouth daily.      . lansoprazole (PREVACID) 30 MG capsule Take 1 capsule (30 mg total) by mouth 2 (two) times daily before a meal.  60 capsule  2  . metoprolol succinate (TOPROL-XL) 100 MG 24 hr tablet Take 100 mg by mouth daily. Take with  or immediately following a meal.      . spironolactone (ALDACTONE) 50 MG tablet Take 50 mg by mouth daily.      . Vancomycin HCl (FIRST-VANCOMYCIN 50) 50 MG/ML SOLN Take 5 mLs by mouth 4 (four) times daily.  280 mL  0     Lab Results: UA: benign   Recent Labs  07/28/13 1101 07/28/13 1428 07/30/13 1434  WBC 21.7 Repeated and verified X2.* 21.7 cH* 22.8 Repeated and verified X2.*  HGB 10.8* 10.9* 10.1*  HCT 32.9* 33.4* 30.7*  PLT 99.0* 102.0* 89.0*   BMET  Recent Labs  07/28/13 1101 07/28/13 1428 07/30/13 1434  NA 132* 131* 132*  K 4.9 4.7 5.0  CL 108 107 108  CO2 17* 17* 16*  GLUCOSE 100* 132* 126*  BUN 57* 58* 62*  CREATININE 3.5* 3.7* 3.6*  CALCIUM 8.6 8.6 8.6   LFT  Recent Labs  07/28/13 1428  PROT 7.2  ALBUMIN 2.7*  AST 23  ALT 15  ALKPHOS 657*  BILITOT 0.8   No results found.  ROS: No change in vision SOB due to ascites No CP  + edema x 1 month No dysuria + abd  distention No arthritic CO No neuropathic Sxs   PHYSICAL EXAM: Blood pressure 110/75, pulse 72, temperature 97.9 F (36.6 C), temperature source Oral, resp. rate 16, height 5\' 7"  (1.702 m), weight 79.9 kg (176 lb 2.4 oz), SpO2 100.00%. HEENT: PERRLA EOMI.  Mild periorbital edema NECK:No JVD, No bruits LUNGS:clear CARDIAC:RRR wo MRG ABD:+ BS NT  + distension  + ascites EXT:3+ edema NEURO:CNI M&SI  No asterixis  Assessment: 1. Acute on CKD3.  CKD ? From HTN.  Acute component most likely secondary to intravascular volume depletion and mild hypotension.  Time will tell if this is hepatorenal or not 2. Cirrhosis 3. Hx HTN PLAN: 1. Agree with hydration but after his initial bolus will give IV albumin q 6hr x 6 doses 2. Daily Scr 3. Hold amlodipine and spironolactone as you are doing 4. Avoid ACE/ARB/NSAID for ever and always 5. Try to avoid large volume paracentesis as much as possible 6. I/O's 7. Would only get another renal US if Scr does not improve with hydration 8. Treat C diff as you are doing   Windy Kalata 07/30/2013, 6:37 PM

## 2013-07-31 ENCOUNTER — Ambulatory Visit: Payer: Medicare Other | Admitting: Physician Assistant

## 2013-07-31 DIAGNOSIS — R188 Other ascites: Secondary | ICD-10-CM

## 2013-07-31 DIAGNOSIS — K766 Portal hypertension: Secondary | ICD-10-CM

## 2013-07-31 DIAGNOSIS — A0472 Enterocolitis due to Clostridium difficile, not specified as recurrent: Secondary | ICD-10-CM

## 2013-07-31 DIAGNOSIS — K7460 Unspecified cirrhosis of liver: Principal | ICD-10-CM

## 2013-07-31 DIAGNOSIS — N179 Acute kidney failure, unspecified: Secondary | ICD-10-CM

## 2013-07-31 DIAGNOSIS — D72829 Elevated white blood cell count, unspecified: Secondary | ICD-10-CM

## 2013-07-31 LAB — RENAL FUNCTION PANEL
Albumin: 3.3 g/dL — ABNORMAL LOW (ref 3.5–5.2)
BUN: 63 mg/dL — ABNORMAL HIGH (ref 6–23)
CALCIUM: 8.6 mg/dL (ref 8.4–10.5)
CHLORIDE: 106 meq/L (ref 96–112)
CO2: 15 meq/L — AB (ref 19–32)
Creatinine, Ser: 3.19 mg/dL — ABNORMAL HIGH (ref 0.50–1.35)
GFR calc non Af Amer: 18 mL/min — ABNORMAL LOW (ref >90)
GFR, EST AFRICAN AMERICAN: 21 mL/min — AB (ref >90)
GLUCOSE: 129 mg/dL — AB (ref 70–99)
Phosphorus: 6.3 mg/dL — ABNORMAL HIGH (ref 2.3–4.6)
Potassium: 5.4 mEq/L — ABNORMAL HIGH (ref 3.7–5.3)
SODIUM: 135 meq/L — AB (ref 137–147)

## 2013-07-31 LAB — CBC
HCT: 31.4 % — ABNORMAL LOW (ref 39.0–52.0)
Hemoglobin: 9.6 g/dL — ABNORMAL LOW (ref 13.0–17.0)
MCH: 27.9 pg (ref 26.0–34.0)
MCHC: 30.6 g/dL (ref 30.0–36.0)
MCV: 91.3 fL (ref 78.0–100.0)
PLATELETS: DECREASED 10*3/uL (ref 150–400)
RBC: 3.44 MIL/uL — AB (ref 4.22–5.81)
RDW: 16.4 % — ABNORMAL HIGH (ref 11.5–15.5)
WBC: 25.3 10*3/uL — AB (ref 4.0–10.5)

## 2013-07-31 LAB — PARATHYROID HORMONE, INTACT (NO CA): PTH: 60.9 pg/mL (ref 14.0–72.0)

## 2013-07-31 MED ORDER — FAMOTIDINE 20 MG PO TABS
20.0000 mg | ORAL_TABLET | Freq: Two times a day (BID) | ORAL | Status: DC
  Administered 2013-07-31 – 2013-08-02 (×4): 20 mg via ORAL
  Filled 2013-07-31 (×5): qty 1

## 2013-07-31 NOTE — Progress Notes (Signed)
Reviewed and agree with management. Riddik Senna D. Missi Mcmackin, M.D., FACG  

## 2013-07-31 NOTE — Progress Notes (Signed)
Assessment:  1. Acute on CKD3. CKD, slightly improved prerenal physiology 2. Cirrhosis 3. Hx HTN PLAN: Continue supportive therapy  Subjective: Interval History: full abdomen  Objective: Vital signs in last 24 hours: Temp:  [97.2 F (36.2 C)-98.2 F (36.8 C)] 98 F (36.7 C) (05/07 1130) Pulse Rate:  [72-88] 88 (05/07 1130) Resp:  [16-20] 20 (05/07 1130) BP: (94-135)/(64-78) 127/76 mmHg (05/07 1130) SpO2:  [94 %-100 %] 100 % (05/07 1130) Weight:  [79.9 kg (176 lb 2.4 oz)-80.287 kg (177 lb)] 79.9 kg (176 lb 2.4 oz) (05/06 1640) Weight change:   Intake/Output from previous day: 05/06 0701 - 05/07 0700 In: 640 [P.O.:240; IV Piggyback:400] Out: 115 [Urine:115] Intake/Output this shift: Total I/O In: 200 [IV Piggyback:200] Out: -   General appearance: alert and cooperative GI: tense distention Extremities: edema 2  Lab Results:  Recent Labs  07/30/13 1434 07/31/13 0558  WBC 22.8 Repeated and verified X2.* 25.3*  HGB 10.1* 9.6*  HCT 30.7* 31.4*  PLT 89.0* PLATELET CLUMPS NOTED ON SMEAR, COUNT APPEARS DECREASED   BMET:  Recent Labs  07/30/13 1434 07/31/13 0558  NA 132* 135*  K 5.0 5.4*  CL 108 106  CO2 16* 15*  GLUCOSE 126* 129*  BUN 62* 63*  CREATININE 3.6* 3.19*  CALCIUM 8.6 8.6    Recent Labs  07/31/13 0558  PTH 60.9   Iron Studies: No results found for this basename: IRON, TIBC, TRANSFERRIN, FERRITIN,  in the last 72 hours Studies/Results: Dg Chest 2 View  07/30/2013   CLINICAL DATA:  Lower extremity swelling, leukocytosis  EXAM: CHEST  2 VIEW  COMPARISON:  No comparisons  FINDINGS: The heart size and mediastinal contours are within normal limits. Both lungs are clear. The visualized skeletal structures are unremarkable.  IMPRESSION: No active cardiopulmonary disease.   Electronically Signed   By: Kathreen Devoid   On: 07/30/2013 19:59   Scheduled: . albumin human  50 g Intravenous Q6H  . midodrine  10 mg Oral TID WC  . multivitamin with minerals  1  tablet Oral Daily  . octreotide  50 mcg Subcutaneous 3 times per day  . pantoprazole  40 mg Oral Daily  . vancomycin  250 mg Oral 4 times per day     LOS: 1 day   Estanislado Emms 07/31/2013,1:00 PM

## 2013-07-31 NOTE — Progress Notes (Signed)
Patient seen, examined, and I agree with the above documentation, including the assessment and plan. Some improvement in renal function with IVF, IV albumin, midodrine and octreotide.  Concern is for HRS C diff treatment with oral vanco  Leukocytosis likely related to c diff, but if continues would perform dx paracentesis to rule out SBP Florastor BID Can change in H2 blocker instead of PPI in the setting of c diff. Hold on LVP now given renal dysfunction, also holding diuretics Poor prognosis overall with decompensated liver disease Discussed with pt, significant other, daughter, and sister at bedside Appreciate renal involvement

## 2013-07-31 NOTE — Progress Notes (Signed)
Pt tolerated 50gm of Albumin vital signs charted in epic. Arthor Captain LPN

## 2013-07-31 NOTE — Progress Notes (Signed)
Daily Rounding Note  07/31/2013, 1:54 PM  LOS: 1 day   SUBJECTIVE:      On hepatorenal syndrome protocol with midodrine, subq octreotide. Says he has had about 5 softer stools so far today, and this is better than it was.  Says he is urinating a lot, not reflected in recorded output, which has not been recorded today.  Belly getting tighter.  OBJECTIVE:         Vital signs in last 24 hours:    Temp:  [97.2 F (36.2 C)-98.2 F (36.8 C)] 98 F (36.7 C) (05/07 1130) Pulse Rate:  [72-88] 88 (05/07 1130) Resp:  [16-20] 20 (05/07 1130) BP: (94-135)/(64-78) 127/76 mmHg (05/07 1130) SpO2:  [94 %-100 %] 100 % (05/07 1130) Weight:  [79.9 kg (176 lb 2.4 oz)-80.287 kg (177 lb)] 79.9 kg (176 lb 2.4 oz) (05/06 1640) Last BM Date: 07/30/13 General: frail, comfortable AAM   Heart: RRR Chest: clear bil.  No dyspnea or cough Abdomen: tense and distended,  Not tender.  BS hypoactive  Extremities: no CCE Neuro/Psych:  3 + edema in LE/    Intake/Output from previous day: 05/06 0701 - 05/07 0700 In: 640 [P.O.:240; IV Piggyback:400] Out: 115 [Urine:115]  Intake/Output this shift: Total I/O In: 200 [IV Piggyback:200] Out: -   Lab Results:  Recent Labs  07/28/13 1428 07/30/13 1434 07/31/13 0558  WBC 21.7 cH* 22.8 Repeated and verified X2.* 25.3*  HGB 10.9* 10.1* 9.6*  HCT 33.4* 30.7* 31.4*  PLT 102.0* 89.0* PLATELET CLUMPS NOTED ON SMEAR, COUNT APPEARS DECREASED   BMET  Recent Labs  07/28/13 1428 07/30/13 1434 07/31/13 0558  NA 131* 132* 135*  K 4.7 5.0 5.4*  CL 107 108 106  CO2 17* 16* 15*  GLUCOSE 132* 126* 129*  BUN 58* 62* 63*  CREATININE 3.7* 3.6* 3.19*  CALCIUM 8.6 8.6 8.6   LFT  Recent Labs  07/28/13 1428 07/31/13 0558  PROT 7.2  --   ALBUMIN 2.7* 3.3*  AST 23  --   ALT 15  --   ALKPHOS 657*  --   BILITOT 0.8  --    PT/INR  Recent Labs  07/28/13 1428 07/30/13 1855  LABPROT 15.7* 16.3*  INR  1.4* 1.34   Hepatitis Panel No results found for this basename: HEPBSAG, HCVAB, HEPAIGM, HEPBIGM,  in the last 72 hours  Studies/Results: Dg Chest 2 View  07/30/2013   CLINICAL DATA:  Lower extremity swelling, leukocytosis  EXAM: CHEST  2 VIEW  COMPARISON:  No comparisons  FINDINGS: The heart size and mediastinal contours are within normal limits. Both lungs are clear. The visualized skeletal structures are unremarkable.  IMPRESSION: No active cardiopulmonary disease.   Electronically Signed   By: Kathreen Devoid   On: 07/30/2013 19:59   Scheduled Meds: . albumin human  50 g Intravenous Q6H  . midodrine  10 mg Oral TID WC  . multivitamin with minerals  1 tablet Oral Daily  . octreotide  50 mcg Subcutaneous 3 times per day  . pantoprazole  40 mg Oral Daily  . vancomycin  250 mg Oral 4 times per day   Continuous Infusions:  PRN Meds:.acetaminophen, acetaminophen, HYDROcodone-acetaminophen, ondansetron (ZOFRAN) IV, ondansetron    ASSESMENT:   *  cryptogenic cirrhosis.  *  Ascites. Several recent paracentesis: 3/18 270 cc , 4/21 6.2 liters, 4/30 6.5 liters.  Had 1047 WBCs on 3/18 and 310 WBCs on 4/21 in ascitic fluid.  WBCs climbing.   *  Progressive CKD, stage 3.  Improving prerenal physiology per Dr Florene Glen.  6 doses of Albumin added by renal in addition to midodrine and octreotide.  Oliguric.   *  c diff colitis, + PCR 4/22.  Treated with Metronidazole initially, switched 4/30 to oral vanc for14 days.  WBCs climbing.   *  Colonoscopy 07/08/13: removal 3 small polyps. TUBULAR ADENOMA (2) AND BENIGN LYMPHOID POLYP. NO HIGH GRADE DYSPLASIA OR MALIGNANCY IDENTIFIED.  *  EGD 07/08/13: portal hypertensive gastropathy, candida esophagitis and no varices.  - DENSELY INFLAMED SQUAMOUS MUCOSA WITH FUNGAL ORGANISMS CONSISTENT WITH CANDIDA. - THERE IS NO EVIDENCE OF DYSPLASIA OR MALIGNANCY. Rx'd with Fluconazole.   *  hyperglycemia    PLAN   *  ? How long to continue Midodrine and  octreotide? *  daily weights. Advised pt to use urinal so urine can get measured.  *  Given the C diff and no acid/peptic disease on 05/2013 EGD should we switch pt to Pepcid/Zantac and stop PPI? *  Needs another paracentesis but will need to be lower volume and accompanied with additonal albumin.  Will let MDs decide specifics.     Vena Rua  07/31/2013, 1:54 PM Pager: [phone]

## 2013-08-01 ENCOUNTER — Inpatient Hospital Stay (HOSPITAL_COMMUNITY): Payer: Medicare Other

## 2013-08-01 LAB — CBC WITH DIFFERENTIAL/PLATELET
BASOS ABS: 0 10*3/uL (ref 0.0–0.1)
BASOS PCT: 0 % (ref 0–1)
Band Neutrophils: 0 % (ref 0–10)
Blasts: 0 %
Eosinophils Absolute: 0.4 10*3/uL (ref 0.0–0.7)
Eosinophils Relative: 1 % (ref 0–5)
HEMATOCRIT: 31.2 % — AB (ref 39.0–52.0)
HEMOGLOBIN: 9.5 g/dL — AB (ref 13.0–17.0)
LYMPHS PCT: 3 % — AB (ref 12–46)
Lymphs Abs: 1.2 10*3/uL (ref 0.7–4.0)
MCH: 28.2 pg (ref 26.0–34.0)
MCHC: 30.4 g/dL (ref 30.0–36.0)
MCV: 92.6 fL (ref 78.0–100.0)
MONO ABS: 7.7 10*3/uL — AB (ref 0.1–1.0)
MYELOCYTES: 6 %
Metamyelocytes Relative: 3 %
Monocytes Relative: 20 % — ABNORMAL HIGH (ref 3–12)
NEUTROS PCT: 67 % (ref 43–77)
NRBC: 0 /100{WBCs}
Neutro Abs: 29.3 10*3/uL — ABNORMAL HIGH (ref 1.7–7.7)
PROMYELOCYTES ABS: 0 %
Platelets: 82 10*3/uL — ABNORMAL LOW (ref 150–400)
RBC: 3.37 MIL/uL — ABNORMAL LOW (ref 4.22–5.81)
RDW: 16.4 % — ABNORMAL HIGH (ref 11.5–15.5)
WBC: 38.6 10*3/uL — ABNORMAL HIGH (ref 4.0–10.5)

## 2013-08-01 LAB — BODY FLUID CELL COUNT WITH DIFFERENTIAL
Eos, Fluid: 0 %
LYMPHS FL: 18 %
MONOCYTE-MACROPHAGE-SEROUS FLUID: 75 % (ref 50–90)
Neutrophil Count, Fluid: 7 % (ref 0–25)
Total Nucleated Cell Count, Fluid: 548 cu mm (ref 0–1000)

## 2013-08-01 LAB — COMPREHENSIVE METABOLIC PANEL
ALBUMIN: 4.5 g/dL (ref 3.5–5.2)
ALT: 8 U/L (ref 0–53)
AST: 13 U/L (ref 0–37)
Alkaline Phosphatase: 453 U/L — ABNORMAL HIGH (ref 39–117)
BILIRUBIN TOTAL: 0.9 mg/dL (ref 0.3–1.2)
BUN: 61 mg/dL — ABNORMAL HIGH (ref 6–23)
CHLORIDE: 104 meq/L (ref 96–112)
CO2: 14 meq/L — AB (ref 19–32)
Calcium: 9.1 mg/dL (ref 8.4–10.5)
Creatinine, Ser: 3.23 mg/dL — ABNORMAL HIGH (ref 0.50–1.35)
GFR calc Af Amer: 21 mL/min — ABNORMAL LOW (ref >90)
GFR calc non Af Amer: 18 mL/min — ABNORMAL LOW (ref >90)
Glucose, Bld: 119 mg/dL — ABNORMAL HIGH (ref 70–99)
POTASSIUM: 5.4 meq/L — AB (ref 3.7–5.3)
SODIUM: 136 meq/L — AB (ref 137–147)
Total Protein: 7.6 g/dL (ref 6.0–8.3)

## 2013-08-01 LAB — PROTIME-INR
INR: 1.53 — ABNORMAL HIGH (ref 0.00–1.49)
Prothrombin Time: 18 seconds — ABNORMAL HIGH (ref 11.6–15.2)

## 2013-08-01 MED ORDER — METRONIDAZOLE IN NACL 5-0.79 MG/ML-% IV SOLN
500.0000 mg | Freq: Three times a day (TID) | INTRAVENOUS | Status: DC
  Administered 2013-08-01 – 2013-08-09 (×24): 500 mg via INTRAVENOUS
  Filled 2013-08-01 (×26): qty 100

## 2013-08-01 MED ORDER — SODIUM POLYSTYRENE SULFONATE 15 GM/60ML PO SUSP
30.0000 g | Freq: Once | ORAL | Status: AC
  Administered 2013-08-01: 30 g via ORAL
  Filled 2013-08-01: qty 120

## 2013-08-01 MED ORDER — ALBUMIN HUMAN 25 % IV SOLN
50.0000 g | Freq: Four times a day (QID) | INTRAVENOUS | Status: AC
  Administered 2013-08-01 – 2013-08-02 (×2): 50 g via INTRAVENOUS
  Filled 2013-08-01 (×2): qty 200

## 2013-08-01 NOTE — Progress Notes (Signed)
Patient seen, examined, and I agree with the above documentation, including the assessment and plan. Appreciate ID involvement IV metronidazole added to oral vancomycin Diagnostic paracentesis reviewed only 7% neutrophils (neutrophil count less than 50 cells) which is not consistent with SBP White count has continued to rise, question leukemoid reaction Treating C. difficile now with 2 drugs Ongoing renal failure though renal function stable above baseline. Continue midodrine and octreotide while holding diuretics and avoiding large volume paracentesis due to the risk of worsening renal function

## 2013-08-01 NOTE — Progress Notes (Signed)
Daily Rounding Note  08/01/2013, 10:10 AM  LOS: 2 days   SUBJECTIVE:       Just 200 cc urine recorded yesterday. Weight is 188 #, c/w 177# yesterday, 176 # 2 days ago.  Had 4 loose stools this AM, several yesterday, no abd pain.  No nausea.  Denies SOB or cough.   OBJECTIVE:         Vital signs in last 24 hours:    Temp:  [97.7 F (36.5 C)-98.1 F (36.7 C)] 98.1 F (36.7 C) (05/08 0602) Pulse Rate:  [88-93] 90 (05/08 0602) Resp:  [17-20] 17 (05/08 0602) BP: (127-133)/(76-81) 133/77 mmHg (05/08 0602) SpO2:  [100 %] 100 % (05/08 0602) Weight:  [85.6 kg (188 lb 11.4 oz)] 85.6 kg (188 lb 11.4 oz) (05/08 0602) Last BM Date: 08/01/13 General: Looks unwell, comfortable, quiet Heart: RRR Chest: clear bil Abdomen: distended, tense, scant BS, denies tenderness  Extremities: 3 + pedal edema Neuro/Psych:  Cooperative, pleasant, no confusion.  No limb weakness.  No asterixis.   Intake/Output from previous day: 05/07 0701 - 05/08 0700 In: 1360 [P.O.:960; IV Piggyback:400] Out: 200 [Urine:200]  Intake/Output this shift:    Lab Results:  Recent Labs  07/30/13 1434 07/31/13 0558 08/01/13 0750  WBC 22.8 Repeated and verified X2.* 25.3* 38.6*  HGB 10.1* 9.6* 9.5*  HCT 30.7* 31.4* 31.2*  PLT 89.0* PLATELET CLUMPS NOTED ON SMEAR, COUNT APPEARS DECREASED 82*   BMET  Recent Labs  07/30/13 1434 07/31/13 0558 08/01/13 0750  NA 132* 135* 136*  K 5.0 5.4* 5.4*  CL 108 106 104  CO2 16* 15* 14*  GLUCOSE 126* 129* 119*  BUN 62* 63* 61*  CREATININE 3.6* 3.19* 3.23*  CALCIUM 8.6 8.6 9.1   LFT  Recent Labs  07/31/13 0558 08/01/13 0750  PROT  --  7.6  ALBUMIN 3.3* 4.5  AST  --  13  ALT  --  8  ALKPHOS  --  453*  BILITOT  --  0.9   PT/INR  Recent Labs  07/30/13 1855 08/01/13 0750  LABPROT 16.3* 18.0*  INR 1.34 1.53*   Hepatitis Panel No results found for this basename: HEPBSAG, HCVAB, HEPAIGM, HEPBIGM,   in the last 72 hours  Studies/Results: Dg Chest 2 View  07/30/2013   CLINICAL DATA:  Lower extremity swelling, leukocytosis  EXAM: CHEST  2 VIEW  COMPARISON:  No comparisons  FINDINGS: The heart size and mediastinal contours are within normal limits. Both lungs are clear. The visualized skeletal structures are unremarkable.  IMPRESSION: No active cardiopulmonary disease.   Electronically Signed   By: Kathreen Devoid   On: 07/30/2013 19:59   Scheduled Meds: . famotidine  20 mg Oral BID  . midodrine  10 mg Oral TID WC  . multivitamin with minerals  1 tablet Oral Daily  . octreotide  50 mcg Subcutaneous 3 times per day  . vancomycin  250 mg Oral 4 times per day   Continuous Infusions:  PRN Meds:.acetaminophen, acetaminophen, HYDROcodone-acetaminophen, ondansetron (ZOFRAN) IV, ondansetron  ASSESMENT:   * Cryptogenic cirrhosis.  * Ascites. Several recent paracentesis: 3/18 270 cc , 4/21 6.2 liters, 4/30 6.5 liters. Had 1047 WBCs on 3/18 and 310 WBCs on 4/21 in ascitic fluid. WBCs much higher. Abdomen distended but no plans to pursue another paracentesis with renal failure.  * Progressive CKD, stage 3 baseline. Improving prerenal physiology per yesterday's note from Dr Florene Glen. 6 doses of Albumin added by  renal in addition to midodrine and octreotide. Oliguric.  * C diff colitis, + PCR 4/22. Treated with Metronidazole initially, switched 4/30 to oral vanc for 14 days. WBCs climbing.  * Colonoscopy 07/08/13: removal 3 small polyps. TA and HP polyps  * EGD 07/08/13: portal hypertensive gastropathy, candida esophagitis and no varices.  Rx'd with Fluconazole.  * hyperglycemia *  Thrombocytopenia.  *  Anemia. Normoctic. overal Hgb down about 1 gram.   *  Coagulopathy.      PLAN   *  ID consult.  Dr Johnnye Sima will see.  *  Diagnostic paracentesis for cell count, diff and clx. Smallest volume possible.  *  BMET, CBC, coags in AM     Vena Rua  08/01/2013, 10:10 AM Pager: [phone]

## 2013-08-01 NOTE — Progress Notes (Signed)
Assessment:  1. Acute on CKD3. CKD,  prerenal physiology poss HRS 2. Cirrhosis 3. Hx HTN 4    Hx of C diff PLAN: Continue supportive therapy.  Will give two more albumin boluses.  No other recommendations.   Subjective: Interval History: oliguric now  Objective: Vital signs in last 24 hours: Temp:  [97 F (36.1 C)-98.1 F (36.7 C)] 97 F (36.1 C) (05/08 1527) Pulse Rate:  [84-93] 93 (05/08 1527) Resp:  [16-20] 20 (05/08 1527) BP: (128-145)/(74-90) 145/83 mmHg (05/08 1527) SpO2:  [95 %-100 %] 99 % (05/08 1527) Weight:  [85.6 kg (188 lb 11.4 oz)] 85.6 kg (188 lb 11.4 oz) (05/08 0602) Weight change: 0.7 kg (1 lb 8.7 oz)  Intake/Output from previous day: 05/07 0701 - 05/08 0700 In: 1360 [P.O.:960; IV Piggyback:400] Out: 200 [Urine:200] Intake/Output this shift: Total I/O In: 720 [P.O.:720] Out: 100 [Urine:100]  General appearance: alert and cooperative GI: protuberant nontender Extremities: edema 2+ bilat  Lab Results:  Recent Labs  07/31/13 0558 08/01/13 0750  WBC 25.3* 38.6*  HGB 9.6* 9.5*  HCT 31.4* 31.2*  PLT PLATELET CLUMPS NOTED ON SMEAR, COUNT APPEARS DECREASED 82*   BMET:  Recent Labs  07/31/13 0558 08/01/13 0750  NA 135* 136*  K 5.4* 5.4*  CL 106 104  CO2 15* 14*  GLUCOSE 129* 119*  BUN 63* 61*  CREATININE 3.19* 3.23*  CALCIUM 8.6 9.1    Recent Labs  07/31/13 0558  PTH 60.9   Iron Studies: No results found for this basename: IRON, TIBC, TRANSFERRIN, FERRITIN,  in the last 72 hours Studies/Results: Dg Chest 2 View  07/30/2013   CLINICAL DATA:  Lower extremity swelling, leukocytosis  EXAM: CHEST  2 VIEW  COMPARISON:  No comparisons  FINDINGS: The heart size and mediastinal contours are within normal limits. Both lungs are clear. The visualized skeletal structures are unremarkable.  IMPRESSION: No active cardiopulmonary disease.   Electronically Signed   By: Kathreen Devoid   On: 07/30/2013 19:59   US Paracentesis  08/01/2013   CLINICAL DATA:   Abdominal ascites ; renal failure  EXAM: ULTRASOUND GUIDED left lower quadrant PARACENTESIS  COMPARISON:  None.  PROCEDURE: An ultrasound guided paracentesis was thoroughly discussed with the patient and questions answered. The benefits, risks, alternatives and complications were also discussed. The patient understands and wishes to proceed with the procedure. Written consent was obtained.  Ultrasound was performed to localize and mark an adequate pocket of fluid in the left lower quadrant of the abdomen. The area was then prepped and draped in the normal sterile fashion. 1% Lidocaine was used for local anesthesia. Under ultrasound guidance a 19 gauge Yueh catheter was introduced. Paracentesis was performed. The catheter was removed and a dressing applied.  Complications: None.  FINDINGS: A total of approximately 180 cc of cloudy yellow fluid was removed. A fluid sample was sent for laboratory analysis.  IMPRESSION: Successful ultrasound guided paracentesis yielding 180 cc of ascites.  Read by: Jannifer Franklin [facility]   Electronically Signed   By: Arne Cleveland M.D.   On: 08/01/2013 14:36   Scheduled: . famotidine  20 mg Oral BID  . metronidazole  500 mg Intravenous Q8H  . midodrine  10 mg Oral TID WC  . multivitamin with minerals  1 tablet Oral Daily  . octreotide  50 mcg Subcutaneous 3 times per day  . vancomycin  250 mg Oral 4 times per day     LOS: 2 days   Estanislado Emms 08/01/2013,6:46  PM   

## 2013-08-01 NOTE — Consult Note (Addendum)
Michael Bowers for Infectious Disease  Date of Admission:  07/30/2013  Date of Consult:  08/01/2013  Reason for Consult: Leukocytosis Referring Physician: Pyrtle  Impression/Recommendation Leukocytosis C diff Hepatorenal syndrome ? SBP  Would: Add IV flagyl Await peritoneal fluid studies May need to consider broadening anbx if abd fluid shows signs of peritonitis.   Comment Antibiotic resistant C diff is nearly unheard of. Would question if he is getting proper transit of vanco? Certainly SBP needs to be ruled out.   Thank you so much for this interesting consult,   Michael Bowers (pager) [phone] [email or web address]  Michael Bowers is an 68 y.o. male.  HPI: 68 yo M with hx of CAD/CABG, CRI and cryptogenic cirrhosis. He was dx with C diff in mid-April after colonoscopy. He was initially treated with flagyl then transitioned to po vanco on 4-27. He comes to ED on 5-6 with leukocytosis, decompensated cirrhosis, and worsening Cr.  His Cr increase was felt to be due to paracentesis last week. His diuretics were held, has has been fluid loaded and given albumin. He has also been started on octreotide, midodrine. His PPI was changed to H2 blocker due to his C diff.  Despite this, his WBC has increased steadily in hospital (38.6 today).  He underwent repeat paracentesis today.    Past Medical History  Diagnosis Date  . HTN (hypertension)   . CAD (coronary artery disease)     [name];  [name], no ischemia, [name]  . CKD (chronic kidney disease)   . ED (erectile dysfunction)   . Colon polyps   . Cirrhosis     cryptogennic  . Hepatomegaly   . Ascites     [name] 07/30/2013  . GERD (gastroesophageal reflux disease)     Past Surgical History  Procedure Laterality Date  . Colonoscopy  10/2007    Dr. Amedeo Plenty, inflammatory polyp  . Paracentesis      "several"/notes 07/30/2013   . Coronary angioplasty with stent placement  02/2000    1 stent/notes 02/28/2000     No Known Allergies  Medications:  Scheduled: . famotidine  20 mg Oral BID  . midodrine  10 mg Oral TID WC  . multivitamin with minerals  1 tablet Oral Daily  . octreotide  50 mcg Subcutaneous 3 times per day  . vancomycin  250 mg Oral 4 times per day    Abtx:  Anti-infectives   Start     Dose/Rate Route Frequency Ordered Stop   07/30/13 1800  vancomycin (VANCOCIN) 50 mg/mL oral solution 250 mg     250 mg Oral 4 times per day 07/30/13 1627        Total days of antibiotics 3 (po vanco)          Social History:  reports that he has never smoked. He has never used smokeless tobacco. He reports that he does not drink alcohol or use illicit drugs.  Family History  Problem Relation Age of Onset  . Colon cancer Neg Hx   . Colon polyps Neg Hx   . Liver disease Neg Hx   . Hypertension Father   . Hypertension Sister   . Hypertension Brother   . Hypertension Sister   . Hypertension Sister   . Hypertension Sister   . Hypertension Sister   . Hypertension Brother   . Hypertension Brother     General  ROS: denies f/c. has had at least 10 loose BM today. + LE edema. see HPI.   Blood pressure 143/90, pulse 92, temperature 97.9 F (36.6 C), temperature source Oral, resp. rate 18, height $RemoveBe'5\' 7"'[id number]$  (1.702 m), weight 85.6 kg (188 lb 11.4 oz), SpO2 100.00%. General appearance: alert, cooperative and no distress Eyes: negative findings: pupils equal, round, reactive to light and accomodation, positive findings: sclera icterus Throat: normal findings: oropharynx pink & moist without lesions or evidence of thrush Neck: no adenopathy and supple, symmetrical, trachea midline Lungs: clear to auscultation bilaterally Heart: regular rate and rhythm Abdomen: normal findings: soft, non-tender and abnormal findings:  distended and massive fluid overload. Extremities: edema > 3+ ankel edema   Results for orders  placed during the hospital encounter of 07/30/13 (from the past 48 hour(s))  PROTIME-INR     Status: Abnormal   Collection Time    07/30/13  6:55 PM      Result Value Ref Range   Prothrombin Time 16.3 (*) 11.6 - 15.2 seconds   INR 1.34  0.00 - 1.49  CBC     Status: Abnormal   Collection Time    07/31/13  5:58 AM      Result Value Ref Range   WBC 25.3 (*) 4.0 - 10.5 K/uL   Comment: REPEATED TO VERIFY   RBC 3.44 (*) 4.22 - 5.81 MIL/uL   Hemoglobin 9.6 (*) 13.0 - 17.0 g/dL   Comment: REPEATED TO VERIFY   HCT 31.4 (*) 39.0 - 52.0 %   MCV 91.3  78.0 - 100.0 fL   MCH 27.9  26.0 - 34.0 pg   MCHC 30.6  30.0 - 36.0 g/dL   RDW 16.4 (*) 11.5 - 15.5 %   Platelets    150 - 400 K/uL   Value: PLATELET CLUMPS NOTED ON SMEAR, COUNT APPEARS DECREASED  RENAL FUNCTION PANEL     Status: Abnormal   Collection Time    07/31/13  5:58 AM      Result Value Ref Range   Sodium 135 (*) 137 - 147 mEq/L   Potassium 5.4 (*) 3.7 - 5.3 mEq/L   Chloride 106  96 - 112 mEq/L   CO2 15 (*) 19 - 32 mEq/L   Glucose, Bld 129 (*) 70 - 99 mg/dL   BUN 63 (*) 6 - 23 mg/dL   Creatinine, Ser 3.19 (*) 0.50 - 1.35 mg/dL   Calcium 8.6  8.4 - 10.5 mg/dL   Phosphorus 6.3 (*) 2.3 - 4.6 mg/dL   Albumin 3.3 (*) 3.5 - 5.2 g/dL   GFR calc non Af Amer 18 (*) >90 mL/min   GFR calc Af Amer 21 (*) >90 mL/min   Comment: (NOTE)     The eGFR has been calculated using the CKD EPI equation.     This calculation has not been validated in all clinical situations.     eGFR's persistently <90 mL/min signify possible Chronic Kidney     Disease.  PARATHYROID HORMONE, INTACT (NO CA)     Status: None   Collection Time    07/31/13  5:58 AM      Result Value Ref Range   PTH 60.9  14.0 - 72.0 pg/mL   Comment: Performed at [facility]     Status: Abnormal   Collection Time    08/01/13  7:50 AM      Result Value Ref Range   Sodium 136 (*) 137 - 147 mEq/L  Potassium 5.4 (*) 3.7 - 5.3 mEq/L   Chloride  104  96 - 112 mEq/L   CO2 14 (*) 19 - 32 mEq/L   Glucose, Bld 119 (*) 70 - 99 mg/dL   BUN 61 (*) 6 - 23 mg/dL   Creatinine, Ser 3.23 (*) 0.50 - 1.35 mg/dL   Calcium 9.1  8.4 - 10.5 mg/dL   Total Protein 7.6  6.0 - 8.3 g/dL   Albumin 4.5  3.5 - 5.2 g/dL   AST 13  0 - 37 U/L   ALT 8  0 - 53 U/L   Alkaline Phosphatase 453 (*) 39 - 117 U/L   Total Bilirubin 0.9  0.3 - 1.2 mg/dL   GFR calc non Af Amer 18 (*) >90 mL/min   GFR calc Af Amer 21 (*) >90 mL/min   Comment: (NOTE)     The eGFR has been calculated using the CKD EPI equation.     This calculation has not been validated in all clinical situations.     eGFR's persistently <90 mL/min signify possible Chronic Kidney     Disease.  PROTIME-INR     Status: Abnormal   Collection Time    08/01/13  7:50 AM      Result Value Ref Range   Prothrombin Time 18.0 (*) 11.6 - 15.2 seconds   INR 1.53 (*) 0.00 - 1.49  CBC WITH DIFFERENTIAL     Status: Abnormal   Collection Time    08/01/13  7:50 AM      Result Value Ref Range   WBC 38.6 (*) 4.0 - 10.5 K/uL   Comment: WHITE COUNT CONFIRMED ON SMEAR   RBC 3.37 (*) 4.22 - 5.81 MIL/uL   Hemoglobin 9.5 (*) 13.0 - 17.0 g/dL   HCT 31.2 (*) 39.0 - 52.0 %   MCV 92.6  78.0 - 100.0 fL   MCH 28.2  26.0 - 34.0 pg   MCHC 30.4  30.0 - 36.0 g/dL   RDW 16.4 (*) 11.5 - 15.5 %   Platelets 82 (*) 150 - 400 K/uL   Comment: PLATELET COUNT CONFIRMED BY SMEAR   Neutro Abs 29.3 (*) 1.7 - 7.7 K/uL   Lymphs Abs 1.2  0.7 - 4.0 K/uL   Monocytes Absolute 7.7 (*) 0.1 - 1.0 K/uL   Eosinophils Absolute 0.4  0.0 - 0.7 K/uL   Basophils Absolute 0.0  0.0 - 0.1 K/uL   Neutrophils Relative % 67  43 - 77 %   Lymphocytes Relative 3 (*) 12 - 46 %   Monocytes Relative 20 (*) 3 - 12 %   Eosinophils Relative 1  0 - 5 %   Basophils Relative 0  0 - 1 %   Band Neutrophils 0  0 - 10 %   Metamyelocytes Relative 3     Myelocytes 6     Promyelocytes Absolute 0     Blasts 0     nRBC 0  0 /100 WBC   RBC Morphology POLYCHROMASIA  PRESENT     WBC Morphology MILD LEFT SHIFT (1-5% METAS, OCC MYELO, OCC BANDS)        Component Value Date/Time   SDES FLUID ASCITIC 07/15/2013 1220   SPECREQUEST NONE 07/15/2013 1220   CULT  Value: NO GROWTH 3 DAYS Performed at [facility] 06/11/2013 1251   REPTSTATUS 07/15/2013 FINAL 07/15/2013 1220   Dg Chest 2 View  07/30/2013   CLINICAL DATA:  Lower extremity swelling, leukocytosis  EXAM: CHEST  2 VIEW  COMPARISON:  No comparisons  FINDINGS: The heart size and mediastinal contours are within normal limits. Both lungs are clear. The visualized skeletal structures are unremarkable.  IMPRESSION: No active cardiopulmonary disease.   Electronically Signed   By: Kathreen Devoid   On: 07/30/2013 19:59   No results found for this or any previous visit (from the past 240 hour(s)).    08/01/2013, 2:54 PM     LOS: 2 days     **Disclaimer: This note may have been dictated with voice recognition software. Similar sounding words can inadvertently be transcribed and this note may contain transcription errors which may not have been corrected upon publication of note.**

## 2013-08-02 DIAGNOSIS — K767 Hepatorenal syndrome: Secondary | ICD-10-CM

## 2013-08-02 LAB — BASIC METABOLIC PANEL
BUN: 62 mg/dL — ABNORMAL HIGH (ref 6–23)
CHLORIDE: 106 meq/L (ref 96–112)
CO2: 15 mEq/L — ABNORMAL LOW (ref 19–32)
CREATININE: 3.42 mg/dL — AB (ref 0.50–1.35)
Calcium: 9.1 mg/dL (ref 8.4–10.5)
GFR calc non Af Amer: 17 mL/min — ABNORMAL LOW (ref >90)
GFR, EST AFRICAN AMERICAN: 20 mL/min — AB (ref >90)
Glucose, Bld: 111 mg/dL — ABNORMAL HIGH (ref 70–99)
Potassium: 5.1 mEq/L (ref 3.7–5.3)
Sodium: 137 mEq/L (ref 137–147)

## 2013-08-02 LAB — CBC
HCT: 28.9 % — ABNORMAL LOW (ref 39.0–52.0)
Hemoglobin: 8.9 g/dL — ABNORMAL LOW (ref 13.0–17.0)
MCH: 28.4 pg (ref 26.0–34.0)
MCHC: 30.8 g/dL (ref 30.0–36.0)
MCV: 92.3 fL (ref 78.0–100.0)
Platelets: 84 10*3/uL — ABNORMAL LOW (ref 150–400)
RBC: 3.13 MIL/uL — ABNORMAL LOW (ref 4.22–5.81)
RDW: 16.5 % — AB (ref 11.5–15.5)
WBC: 30.2 10*3/uL — AB (ref 4.0–10.5)

## 2013-08-02 LAB — PROTIME-INR
INR: 1.64 — ABNORMAL HIGH (ref 0.00–1.49)
Prothrombin Time: 19 seconds — ABNORMAL HIGH (ref 11.6–15.2)

## 2013-08-02 MED ORDER — FAMOTIDINE 20 MG PO TABS
20.0000 mg | ORAL_TABLET | Freq: Every day | ORAL | Status: DC
  Administered 2013-08-04 – 2013-08-08 (×6): 20 mg via ORAL
  Filled 2013-08-02 (×9): qty 1

## 2013-08-02 MED ORDER — VITAMIN K1 10 MG/ML IJ SOLN
10.0000 mg | Freq: Every day | INTRAMUSCULAR | Status: AC
  Administered 2013-08-02 – 2013-08-04 (×3): 10 mg via SUBCUTANEOUS
  Filled 2013-08-02 (×3): qty 1

## 2013-08-02 NOTE — Progress Notes (Signed)
Daily Rounding Note  08/02/2013, 12:02 PM  LOS: 3 days   SUBJECTIVE:       Urine output is not accurate because he is urinating simultaneously with moving his bowels, and urine is not measured.  Recorded 100cc of urine yesterday. Still eating well, no dyspnea.  Belly feels tight but not painful.  No fever or chills.  Still had 8 or so stools yesterday.   OBJECTIVE:         Vital signs in last 24 hours:    Temp:  [97 F (36.1 C)-98.9 F (37.2 C)] 98.9 F (37.2 C) (05/09 0607) Pulse Rate:  [89-93] 92 (05/09 0607) Resp:  [16-20] 16 (05/09 0607) BP: (120-145)/(75-90) 120/76 mmHg (05/09 0607) SpO2:  [96 %-100 %] 96 % (05/09 0607) Weight:  [85.7 kg (188 lb 15 oz)] 85.7 kg (188 lb 15 oz) (05/09 0607) Last BM Date: 08/02/13 General: pleasant, vcomfortable, looks ill.   Heart: RRR Chest: clear bil.  Abdomen: distended, soft, NT.  BS muffled but active  Extremities: + edema Neuro/Psych:  Pleasant, no confusion   Intake/Output from previous day: Aug 12, 2022 0701 - 05/09 0700 In: 1080 [P.O.:1080] Out: 100 [Urine:100]  Intake/Output this shift: Total I/O In: 240 [P.O.:240] Out: -   Lab Results:  Recent Labs  07/31/13 0558 08-11-2013 0750 08/02/13 0613  WBC 25.3* 38.6* 30.2*  HGB 9.6* 9.5* 8.9*  HCT 31.4* 31.2* 28.9*  PLT PLATELET CLUMPS NOTED ON SMEAR, COUNT APPEARS DECREASED 82* 84*   BMET  Recent Labs  07/31/13 0558 08-11-2013 0750 08/02/13 0613  NA 135* 136* 137  K 5.4* 5.4* 5.1  CL 106 104 106  CO2 15* 14* 15*  GLUCOSE 129* 119* 111*  BUN 63* 61* 62*  CREATININE 3.19* 3.23* 3.42*  CALCIUM 8.6 9.1 9.1   LFT  Recent Labs  07/31/13 0558 2013/08/11 0750  PROT  --  7.6  ALBUMIN 3.3* 4.5  AST  --  13  ALT  --  8  ALKPHOS  --  453*  BILITOT  --  0.9   PT/INR  Recent Labs  08-11-2013 0750 08/02/13 0613  LABPROT 18.0* 19.0*  INR 1.53* 1.64*     Ref. Range Aug 11, 2013 14:14  Color, Fluid Latest Range:  YELLOW  YELLOW (A)  WBC, Fluid Latest Range: 0-1000 cu mm 548  Lymphs, Fluid No range found 18  Eos, Fluid No range found 0  Appearance, Fluid Latest Range: CLEAR  HAZY (A)  Neutrophil Count, Fluid Latest Range: 0-25 % 7  Monocyte-Macrophage-Serous Fluid Latest Range: 50-90 % 75  Specimen Description  FLUID ABDOMEN ASCITIC   Special Requests  Normal   Gram Stain  WBC PRESENT, PREDOMINANTLY MONONUCLEAR NO ORGANISMS SEEN   Hepatitis Panel No results found for this basename: HEPBSAG, HCVAB, HEPAIGM, HEPBIGM,  in the last 72 hours  Studies/Results: US Paracentesis 11-Aug-2013    IMPRESSION: Successful ultrasound guided paracentesis yielding 180 cc of ascites.  Read by: Jannifer Franklin [facility]   Electronically Signed   By: Arne Cleveland M.D.   On: Aug 11, 2013 14:36   Scheduled Meds: . famotidine  20 mg Oral BID  . metronidazole  500 mg Intravenous Q8H  . midodrine  10 mg Oral TID WC  . multivitamin with minerals  1 tablet Oral Daily  . octreotide  50 mcg Subcutaneous 3 times per day  . phytonadione  10 mg Subcutaneous Daily  . vancomycin  250 mg Oral 4 times per day  Continuous Infusions:  PRN Meds:.acetaminophen, acetaminophen, HYDROcodone-acetaminophen, ondansetron (ZOFRAN) IV, ondansetron   ASSESMENT:   * Cryptogenic cirrhosis.  * Ascites. Several recent paracentesis: 3/18 270 cc , 4/21 6.2 liters, 4/30 6.5 liters. Had 1047 WBCs on 3/18 and 310 WBCs on 4/21 in ascitic fluid. WBCs in fluid yesterday 548 but only 7% netrophils. Abdomen distended but no plans to pursue therapeutic paracentesis in setting of renal failure.  * Progressive CKD, stage 3 baseline. ARF with prerenal physiology, ? HRS. 6 doses of Albumin added by renal in addition to midodrine and octreotide. Oliguric.  Worsening BUN/creatinine despite efforts. Oliguric.  * C diff colitis, + PCR 4/22. Treated with Metronidazole initially, switched 4/30 to oral vanc for 14 days. WBCs climbing. IV Flagyl added 08/01/12.   *  Marked  WBC count, ? Leukemoid reaction.  This has improved.  * Colonoscopy 07/08/13: removal 3 small polyps. TA and HP polyps  * EGD 07/08/13: portal hypertensive gastropathy, candida esophagitis and no varices.  Rx'd with Fluconazole.  * hyperglycemia  * Thrombocytopenia.  *  Hyponatremia, improved and now normal.  * Anemia. Normoctic. overal Hgb down about 1 gram.  * Coagulopathy. Slightly increased coags today. Getting  Vit K daily, starting today.     PLAN   *  Dr Florene Glen has seen pt but no note in chart yet *  Broached idea of palliative care consult to pt's dtr, she seems agreeable.  Will not call this yet but if renal function further declines this may be beneficial.  Another daughter is a Marine scientist.     Charlynne Cousins Finneas Mathe  08/02/2013, 12:02 PM Pager: [phone]

## 2013-08-02 NOTE — Progress Notes (Signed)
Assessment:  1. Acute on CKD3. CKD, prerenal physiology poss HRS 2. Cirrhosis 3. Hx HTN       4    Hx of C diff  Plan: Continue supportive therapy. . No other recommendations at this time.  Subjective: Interval History:   Objective: Vital signs in last 24 hours: Temp:  [97 F (36.1 C)-98.9 F (37.2 C)] 98.9 F (37.2 C) (05/09 0607) Pulse Rate:  [89-93] 92 (05/09 0607) Resp:  [16-20] 16 (05/09 0607) BP: (120-145)/(75-90) 120/76 mmHg (05/09 0607) SpO2:  [96 %-100 %] 96 % (05/09 0607) Weight:  [85.7 kg (188 lb 15 oz)] 85.7 kg (188 lb 15 oz) (05/09 0607) Weight change: 5.1 kg (11 lb 3.9 oz)  Intake/Output from previous day: 05/08 0701 - 05/09 0700 In: 1080 [P.O.:1080] Out: 100 [Urine:100] Intake/Output this shift: Total I/O In: 240 [P.O.:240] Out: -   Pleasant man Lungs decreased BS at base Tr presacral edema Abdomen protuberant Ext 1+ edema  Lab Results:  Recent Labs  08/01/13 0750 08/02/13 0613  WBC 38.6* 30.2*  HGB 9.5* 8.9*  HCT 31.2* 28.9*  PLT 82* 84*   BMET:  Recent Labs  08/01/13 0750 08/02/13 0613  NA 136* 137  K 5.4* 5.1  CL 104 106  CO2 14* 15*  GLUCOSE 119* 111*  BUN 61* 62*  CREATININE 3.23* 3.42*  CALCIUM 9.1 9.1    Recent Labs  07/31/13 0558  PTH 60.9   Iron Studies: No results found for this basename: IRON, TIBC, TRANSFERRIN, FERRITIN,  in the last 72 hours Studies/Results: US Paracentesis  08/01/2013   CLINICAL DATA:  Abdominal ascites ; renal failure  EXAM: ULTRASOUND GUIDED left lower quadrant PARACENTESIS  COMPARISON:  None.  PROCEDURE: An ultrasound guided paracentesis was thoroughly discussed with the patient and questions answered. The benefits, risks, alternatives and complications were also discussed. The patient understands and wishes to proceed with the procedure. Written consent was obtained.  Ultrasound was performed to localize and mark an adequate pocket of fluid in the left lower quadrant of the abdomen. The area was  then prepped and draped in the normal sterile fashion. 1% Lidocaine was used for local anesthesia. Under ultrasound guidance a 19 gauge Yueh catheter was introduced. Paracentesis was performed. The catheter was removed and a dressing applied.  Complications: None.  FINDINGS: A total of approximately 180 cc of cloudy yellow fluid was removed. A fluid sample was sent for laboratory analysis.  IMPRESSION: Successful ultrasound guided paracentesis yielding 180 cc of ascites.  Read by: Jannifer Franklin [facility]   Electronically Signed   By: Arne Cleveland M.D.   On: 08/01/2013 14:36      LOS: 3 days   Estanislado Emms 08/02/2013,1:18 PM

## 2013-08-02 NOTE — Progress Notes (Signed)
Patient seen, examined, and I agree with the above documentation, including the assessment and plan. No improvement in renal function, despite additional IV albumin yesterday Blood pressure has not been low and therefore will not increase the midodrine further, cont octreotide [place] Appreciate renal involvement Cont. Flagyl and oral vancomycin for c. Diff, leukocytosis a bit improved today

## 2013-08-03 LAB — CBC
HCT: 30.1 % — ABNORMAL LOW (ref 39.0–52.0)
Hemoglobin: 9.2 g/dL — ABNORMAL LOW (ref 13.0–17.0)
MCH: 28.1 pg (ref 26.0–34.0)
MCHC: 30.6 g/dL (ref 30.0–36.0)
MCV: 92 fL (ref 78.0–100.0)
PLATELETS: 90 10*3/uL — AB (ref 150–400)
RBC: 3.27 MIL/uL — AB (ref 4.22–5.81)
RDW: 16.4 % — ABNORMAL HIGH (ref 11.5–15.5)
WBC: 31.1 10*3/uL — ABNORMAL HIGH (ref 4.0–10.5)

## 2013-08-03 LAB — BASIC METABOLIC PANEL
BUN: 59 mg/dL — ABNORMAL HIGH (ref 6–23)
CALCIUM: 9 mg/dL (ref 8.4–10.5)
CO2: 15 meq/L — AB (ref 19–32)
CREATININE: 3.5 mg/dL — AB (ref 0.50–1.35)
Chloride: 104 mEq/L (ref 96–112)
GFR calc Af Amer: 19 mL/min — ABNORMAL LOW (ref >90)
GFR calc non Af Amer: 16 mL/min — ABNORMAL LOW (ref >90)
Glucose, Bld: 112 mg/dL — ABNORMAL HIGH (ref 70–99)
Potassium: 4.7 mEq/L (ref 3.7–5.3)
SODIUM: 134 meq/L — AB (ref 137–147)

## 2013-08-03 LAB — PROTIME-INR
INR: 1.67 — AB (ref 0.00–1.49)
PROTHROMBIN TIME: 19.2 s — AB (ref 11.6–15.2)

## 2013-08-03 NOTE — Progress Notes (Signed)
Assessment:  1. Acute on CKD3. CKD,  prerenal physiology poss HRS 2. Cirrhosis 3. Hx HTN 4    C diff infection / diarrhea   PLAN:  Has rec'd IVF"s (3.5L+), IV albumin, and is getting midodrine and octreotide. Renal function is not better and UOP remains low with concentrated urine.  BP is normal to high, weight is up 6kg and legs are edematous. Doubt vol depletion. Prob HRS triggered by CDI.  Very tight ascites, recently diagnosed with liver disease, no etiology found yet.  Renal function is not sig better or worse since admission. No new suggestions, cont supportive Rx.     Kelly Splinter MD (pgr) [phone]    [phone] 08/03/2013, 7:26 PM   Subjective: Interval History: oliguric now  Objective: Vital signs in last 24 hours: Temp:  [97.8 F (36.6 C)-98.5 F (36.9 C)] 97.8 F (36.6 C) (05/10 1500) Pulse Rate:  [77-98] 88 (05/10 1500) Resp:  [16-18] 16 (05/10 1500) BP: (126-156)/(65-84) 138/84 mmHg (05/10 1500) SpO2:  [96 %-100 %] 100 % (05/10 1500) Weight:  [85.9 kg (189 lb 6 oz)] 85.9 kg (189 lb 6 oz) (05/10 0554) Weight change: 0.2 kg (7.1 oz)  Intake/Output from previous day: 05/09 0701 - 05/10 0700 In: 240 [P.O.:240] Out: -  Intake/Output this shift:    General appearance: alert and cooperative GI: protuberant nontender Extremities: edema 2+ bilat  Lab Results:  Recent Labs  08/02/13 0613 08/03/13 0305  WBC 30.2* 31.1*  HGB 8.9* 9.2*  HCT 28.9* 30.1*  PLT 84* 90*   BMET:   Recent Labs  08/02/13 0613 08/03/13 0305  NA 137 134*  K 5.1 4.7  CL 106 104  CO2 15* 15*  GLUCOSE 111* 112*  BUN 62* 59*  CREATININE 3.42* 3.50*  CALCIUM 9.1 9.0   No results found for this basename: PTH,  in the last 72 hours Iron Studies: No results found for this basename: IRON, TIBC, TRANSFERRIN, FERRITIN,  in the last 72 hours Studies/Results: No results found. Scheduled: . famotidine  20 mg Oral QHS  . metronidazole  500 mg Intravenous Q8H  . midodrine  10 mg  Oral TID WC  . multivitamin with minerals  1 tablet Oral Daily  . octreotide  50 mcg Subcutaneous 3 times per day  . phytonadione  10 mg Subcutaneous Daily  . vancomycin  250 mg Oral 4 times per day     LOS: 4 days   Sol Blazing 08/03/2013,7:22 PM

## 2013-08-03 NOTE — Progress Notes (Signed)
Daily Rounding Note  08/03/2013, 11:47 AM  LOS: 4 days   SUBJECTIVE:       Urine output when he stools, not measured and no measured urine output.  7 or more stools yesterday.   OBJECTIVE:         Vital signs in last 24 hours:    Temp:  [98.2 F (36.8 C)-98.5 F (36.9 C)] 98.5 F (36.9 C) (05/10 0554) Pulse Rate:  [77-98] 77 (05/10 0554) Resp:  [18] 18 (05/10 0554) BP: (126-156)/(65-83) 126/65 mmHg (05/10 0554) SpO2:  [96 %-98 %] 96 % (05/10 0554) Weight:  [85.9 kg (189 lb 6 oz)] 85.9 kg (189 lb 6 oz) (05/10 0554) Last BM Date: 08/02/13 General: pleasant, comfortable.     Heart: RRR Chest: clear bil Abdomen: distended, tense   Extremities: + edema Neuro/Psych:  Pleasant, no confusion, no limb weakness.   Intake/Output from previous day: 05/09 0701 - 05/10 0700 In: 240 [P.O.:240] Out: -   Intake/Output this shift: Total I/O In: 240 [P.O.:240] Out: -   Lab Results:  Recent Labs  08/01/13 0750 08/02/13 0613 08/03/13 0305  WBC 38.6* 30.2* 31.1*  HGB 9.5* 8.9* 9.2*  HCT 31.2* 28.9* 30.1*  PLT 82* 84* 90*   BMET  Recent Labs  08/01/13 0750 08/02/13 0613 08/03/13 0305  NA 136* 137 134*  K 5.4* 5.1 4.7  CL 104 106 104  CO2 14* 15* 15*  GLUCOSE 119* 111* 112*  BUN 61* 62* 59*  CREATININE 3.23* 3.42* 3.50*  CALCIUM 9.1 9.1 9.0   LFT  Recent Labs  08/01/13 0750  PROT 7.6  ALBUMIN 4.5  AST 13  ALT 8  ALKPHOS 453*  BILITOT 0.9   PT/INR  Recent Labs  08/02/13 0613 08/03/13 0305  LABPROT 19.0* 19.2*  INR 1.64* 1.67*   Scheduled Meds: . famotidine  20 mg Oral QHS  . metronidazole  500 mg Intravenous Q8H  . midodrine  10 mg Oral TID WC  . multivitamin with minerals  1 tablet Oral Daily  . octreotide  50 mcg Subcutaneous 3 times per day  . phytonadione  10 mg Subcutaneous Daily  . vancomycin  250 mg Oral 4 times per day   Continuous Infusions:  PRN Meds:.acetaminophen,  acetaminophen, HYDROcodone-acetaminophen, ondansetron (ZOFRAN) IV, ondansetron   ASSESMENT:   * Cryptogenic cirrhosis.  * Ascites. Paracentesis: 3/18 270 cc , 4/21 6.2 liters, 4/30 6.5 liters. Had 1047 WBCs on 3/18 and 310 WBCs on 4/21 in ascitic fluid.  Abdomen distended but no plans to pursue therapeutic paracentesis with renal failure.  * Progressive CKD, stage 3 baseline. Prerenal physiology/?HRS per Dr Florene Glen. s/p doses of Albumin added by renal in addition to ongoing midodrine and octreotide. Oliguric.  * C diff colitis, + PCR 4/22. Treated with Metronidazole initially, switched 4/30 to oral vanc for 14 days. WBCs climbing.  Now on oral vanc and IV flagyl. Last note from D was 5/8 * Colonoscopy 07/08/13: removal 3 small polyps. TA and HP polyps  * EGD 07/08/13: portal hypertensive gastropathy, candida esophagitis and no varices.  Rx'd with Fluconazole.  * hyperglycemia, not in need of insulin.   * Thrombocytopenia.  * Anemia. Normoctic. overal Hgb down about 1 gram.  * Coagulopathy. No improvement with subq Vitamin K.     PLAN   *  continue current therapies.  Follow labs *  Do we need to do flex sig to see state of [facility], given persistent  diarrhea?     Vena Rua  08/03/2013, 11:47 AM Pager: [phone]

## 2013-08-03 NOTE — Progress Notes (Signed)
Patient seen, examined, and I agree with the above documentation, including the assessment and plan. Continue supportive care  Renal function not better today though not significantly worse We'll continue HRS therapies On dual therapy for C. difficile colitis He continues to state that his stools are soft and mushy which is better than previous. Before admission they had been frankly watery We'll discuss the need for flexible sigmoidoscopy to document improvement of pseudomembranous colitis with colleagues White count continues to be elevated, treatment for C. difficile as above, no evidence for SBP, ID saw pt Friday Significant ascites, lower extremity edema and now penis and scrotum edema. Holding diuretics based on renal function for now, low Na diet

## 2013-08-04 LAB — BASIC METABOLIC PANEL
BUN: 57 mg/dL — AB (ref 6–23)
CALCIUM: 9.2 mg/dL (ref 8.4–10.5)
CO2: 14 mEq/L — ABNORMAL LOW (ref 19–32)
Chloride: 106 mEq/L (ref 96–112)
Creatinine, Ser: 3.53 mg/dL — ABNORMAL HIGH (ref 0.50–1.35)
GFR calc Af Amer: 19 mL/min — ABNORMAL LOW (ref >90)
GFR calc non Af Amer: 16 mL/min — ABNORMAL LOW (ref >90)
GLUCOSE: 101 mg/dL — AB (ref 70–99)
Potassium: 4.5 mEq/L (ref 3.7–5.3)
SODIUM: 137 meq/L (ref 137–147)

## 2013-08-04 LAB — CBC
HCT: 31.6 % — ABNORMAL LOW (ref 39.0–52.0)
HEMOGLOBIN: 9.6 g/dL — AB (ref 13.0–17.0)
MCH: 28.2 pg (ref 26.0–34.0)
MCHC: 30.4 g/dL (ref 30.0–36.0)
MCV: 92.9 fL (ref 78.0–100.0)
Platelets: 103 10*3/uL — ABNORMAL LOW (ref 150–400)
RBC: 3.4 MIL/uL — ABNORMAL LOW (ref 4.22–5.81)
RDW: 16.7 % — ABNORMAL HIGH (ref 11.5–15.5)
WBC: 28.7 10*3/uL — ABNORMAL HIGH (ref 4.0–10.5)

## 2013-08-04 LAB — PROTEIN ELECTROPHORESIS, SERUM
ALBUMIN ELP: 50.9 % — AB (ref 55.8–66.1)
Alpha-1-Globulin: 5.8 % — ABNORMAL HIGH (ref 2.9–4.9)
Alpha-2-Globulin: 8.3 % (ref 7.1–11.8)
BETA 2: 4.5 % (ref 3.2–6.5)
Beta Globulin: 3.3 % — ABNORMAL LOW (ref 4.7–7.2)
Gamma Globulin: 27.2 % — ABNORMAL HIGH (ref 11.1–18.8)
M-Spike, %: 0.39 g/dL
TOTAL PROTEIN ELP: 7.1 g/dL (ref 6.0–8.3)

## 2013-08-04 LAB — HEPATIC FUNCTION PANEL
ALT: 9 U/L (ref 0–53)
AST: 16 U/L (ref 0–37)
Albumin: 4 g/dL (ref 3.5–5.2)
Alkaline Phosphatase: 403 U/L — ABNORMAL HIGH (ref 39–117)
BILIRUBIN DIRECT: 0.7 mg/dL — AB (ref 0.0–0.3)
Indirect Bilirubin: 0.5 mg/dL (ref 0.3–0.9)
Total Bilirubin: 1.2 mg/dL (ref 0.3–1.2)
Total Protein: 7.3 g/dL (ref 6.0–8.3)

## 2013-08-04 LAB — FERRITIN: FERRITIN: 237 ng/mL (ref 22–322)

## 2013-08-04 LAB — PROTIME-INR
INR: 1.72 — ABNORMAL HIGH (ref 0.00–1.49)
Prothrombin Time: 19.7 seconds — ABNORMAL HIGH (ref 11.6–15.2)

## 2013-08-04 LAB — BODY FLUID CULTURE
CULTURE: NO GROWTH
Special Requests: NORMAL

## 2013-08-04 LAB — SODIUM, URINE, RANDOM: Sodium, Ur: <20 mEq/L

## 2013-08-04 LAB — CREATININE, URINE, RANDOM: CREATININE, URINE: 119.22 mg/dL

## 2013-08-04 LAB — IRON AND TIBC
IRON: 33 ug/dL — AB (ref 42–135)
Saturation Ratios: 26 % (ref 20–55)
TIBC: 128 ug/dL — ABNORMAL LOW (ref 215–435)
UIBC: 95 ug/dL — ABNORMAL LOW (ref 125–400)

## 2013-08-04 MED ORDER — SODIUM BICARBONATE 650 MG PO TABS
650.0000 mg | ORAL_TABLET | Freq: Three times a day (TID) | ORAL | Status: DC
  Administered 2013-08-04 – 2013-08-06 (×8): 650 mg via ORAL
  Filled 2013-08-04 (×11): qty 1

## 2013-08-04 MED ORDER — FUROSEMIDE 80 MG PO TABS
160.0000 mg | ORAL_TABLET | Freq: Two times a day (BID) | ORAL | Status: DC
  Administered 2013-08-04 – 2013-08-09 (×10): 160 mg via ORAL
  Filled 2013-08-04 (×6): qty 2
  Filled 2013-08-04: qty 4
  Filled 2013-08-04: qty 2
  Filled 2013-08-04: qty 4
  Filled 2013-08-04 (×4): qty 2

## 2013-08-04 MED ORDER — LOPERAMIDE HCL 2 MG PO CAPS
2.0000 mg | ORAL_CAPSULE | Freq: Two times a day (BID) | ORAL | Status: DC
Start: 2013-08-04 — End: 2013-08-09
  Administered 2013-08-04 – 2013-08-09 (×9): 2 mg via ORAL
  Filled 2013-08-04 (×13): qty 1

## 2013-08-04 NOTE — Progress Notes (Deleted)
          Daily Rounding Note  08/04/2013, 10:29 AM  LOS: 5 days   SUBJECTIVE:       Still no recorded urine output but passed urine while stooling 8 times yesterday.   OBJECTIVE:         Vital signs in last 24 hours:    Temp:  [97.5 F (36.4 C)-97.8 F (36.6 C)] 97.5 F (36.4 C) (05/11 0557) Pulse Rate:  [86-97] 86 (05/11 0557) Resp:  [16-18] 16 (05/11 0557) BP: (127-150)/(71-84) 127/71 mmHg (05/11 0557) SpO2:  [96 %-100 %] 96 % (05/11 0557) Weight:  [85.6 kg (188 lb 11.4 oz)] 85.6 kg (188 lb 11.4 oz) (05/11 0557) Last BM Date: 08/02/13 General: looks the same, not acutely ill but still with distended abdomen   Heart: RRR Chest: clear.  Abdomen: distended, tense, NT.    Extremities: pedal edema Neuro/Psych:  Pleasant, relaxed, in good spirits as always.   Intake/Output from previous day: 05/10 0701 - 05/11 0700 In: 480 [P.O.:480] Out: -   Intake/Output this shift:    Lab Results:  Recent Labs  08/02/13 0613 08/03/13 0305 08/04/13 0615  WBC 30.2* 31.1* 28.7*  HGB 8.9* 9.2* 9.6*  HCT 28.9* 30.1* 31.6*  PLT 84* 90* 103*   BMET  Recent Labs  08/02/13 0613 08/03/13 0305 08/04/13 0615  NA 137 134* 137  K 5.1 4.7 4.5  CL 106 104 106  CO2 15* 15* 14*  GLUCOSE 111* 112* 101*  BUN 62* 59* 57*  CREATININE 3.42* 3.50* 3.53*  CALCIUM 9.1 9.0 9.2   LFT  Recent Labs  08/04/13 0615  PROT 7.3  ALBUMIN 4.0  AST 16  ALT 9  ALKPHOS 403*  BILITOT 1.2  BILIDIR 0.7*  IBILI 0.5   PT/INR  Recent Labs  08/03/13 0305 08/04/13 0615  LABPROT 19.2* 19.7*  INR 1.67* 1.72*    ASSESMENT:   * Cryptogenic cirrhosis.  * Ascites. Paracentesis: 3/18 270 cc, 4/21 6.2 liters, 4/30 6.5 liters. Had 1047 WBCs on 3/18, 310 WBCs on 4/21 in ascitic fluid. Diagnostic tap on 5/8 negative for SBP.  Ongoing marked ascites, no plans to pursue therapeutic paracentesis with renal failure.  * Progressive CKD, stage 3 baseline.  Now with HRS.  Received > 3.5 liters IVF. Completed several doses of Albumin.  Ongoing midodrine and octreotide. Creatinine slowly but steadily rising. Oliguric.  * C diff colitis, + PCR 4/22. Treated with Metronidazole initially, switched 4/30 to oral vanc for 14 days. WBCs climbing.  Now on oral vanc and IV flagyl. Last note from D was 5/8  * Colonoscopy 07/08/13: removal 3 small polyps. TA and HP polyps  * EGD 07/08/13: portal hypertensive gastropathy, candida esophagitis and no varices.  Rx'd with Fluconazole.  * hyperglycemia, not in need of insulin.  * Thrombocytopenia.  * Anemia. Normoctic. overal Hgb down about 1 gram.  * Coagulopathy. No improvement with subq Vitamin K.      PLAN   *  TIPS to address ascites, perhaps help with HRS?     Vena Rua  08/04/2013, 10:29 AM Pager: [phone]

## 2013-08-04 NOTE — Progress Notes (Addendum)
[facility] Progress Note    Since last GI note: Stools more formed, still pretty frequent however.  Most pressing issue is tense ascites.   Objective: Vital signs in last 24 hours: Temp:  [97.5 F (36.4 C)-97.7 F (36.5 C)] 97.7 F (36.5 C) (05/11 1544) Pulse Rate:  [81-97] 81 (05/11 1544) Resp:  [16-18] 16 (05/11 1544) BP: (127-150)/(71-84) 135/80 mmHg (05/11 1544) SpO2:  [96 %-97 %] 97 % (05/11 1544) Weight:  [188 lb 11.4 oz (85.6 kg)] 188 lb 11.4 oz (85.6 kg) (05/11 0557) Last BM Date: 08/04/13 General: alert and oriented times 3 Heart: regular rate and rythm Abdomen: tense, non-tender, +distended, normal bowel sounds; obvious tense ascites Ex: 1-2+ edema bilaterally  Lab Results:  Recent Labs  08/02/13 0613 08/03/13 0305 08/04/13 0615  WBC 30.2* 31.1* 28.7*  HGB 8.9* 9.2* 9.6*  PLT 84* 90* 103*  MCV 92.3 92.0 92.9    Recent Labs  08/02/13 0613 08/03/13 0305 08/04/13 0615  NA 137 134* 137  K 5.1 4.7 4.5  CL 106 104 106  CO2 15* 15* 14*  GLUCOSE 111* 112* 101*  BUN 62* 59* 57*  CREATININE 3.42* 3.50* 3.53*  CALCIUM 9.1 9.0 9.2    Recent Labs  08/04/13 0615  PROT 7.3  ALBUMIN 4.0  AST 16  ALT 9  ALKPHOS 403*  BILITOT 1.2  BILIDIR 0.7*  IBILI 0.5    Recent Labs  08/03/13 0305 08/04/13 0615  INR 1.67* 1.72*     Studies/Results: No results found.   Medications: Scheduled Meds: . famotidine  20 mg Oral QHS  . furosemide  160 mg Oral BID  . metronidazole  500 mg Intravenous Q8H  . midodrine  10 mg Oral TID WC  . multivitamin with minerals  1 tablet Oral Daily  . octreotide  50 mcg Subcutaneous 3 times per day  . sodium bicarbonate  650 mg Oral TID  . vancomycin  250 mg Oral 4 times per day   Continuous Infusions:  PRN Meds:.acetaminophen, acetaminophen, HYDROcodone-acetaminophen, ondansetron (ZOFRAN) IV, ondansetron    Assessment/Plan: 68 y.o. male with cirrhosis, diarrhea, HRS   Diarrhea seems to be improving,  possibly C. Diff related however unusual course since symptoms started after colonoscopy that showed no pseudomembranes.  I agree with continuing vanc, flagyl. Will add bid schedueled imodium as well.  Renal failure; appreciate renal input.  Lasix 160 po bid restarted today.  If this fails to mobilize fluid or causes worsening renal failure I think it is safe to say he has refractory ascites.    Will have interventional radiology consultation (will contact them tomorrow) to discuss TIPS procedure.  It is indicated for refractory ascites but shows mixed results in cases of HRS.  Renal opinion on this would be appreciated as well.  Will order echo to assess cardiac function as worklup for TIPS.    Milus Banister, MD  08/04/2013, 4:25 PM [facility] Pager [phone]

## 2013-08-04 NOTE — Progress Notes (Signed)
S:Appetite good. No new CO O:BP 127/71  Pulse 86  Temp(Src) 97.5 F (36.4 C) (Oral)  Resp 16  Ht $R'5\' 7"'zl$  (1.702 m)  Wt 85.6 kg (188 lb 11.4 oz)  BMI 29.55 kg/m2  SpO2 96%  Intake/Output Summary (Last 24 hours) at 08/04/13 1321 Last data filed at 08/04/13 1049  Gross per 24 hour  Intake      0 ml  Output    200 ml  Net   -200 ml   Weight change: -0.3 kg (-10.6 oz) XIH:WTUUE and alert CVS:RRR Resp:decreased BS bases Abd:+ BS + distention  + ascites Ext:3-4+ edema NEURO:CNI Ox3 no asterixis   . famotidine  20 mg Oral QHS  . metronidazole  500 mg Intravenous Q8H  . midodrine  10 mg Oral TID WC  . multivitamin with minerals  1 tablet Oral Daily  . octreotide  50 mcg Subcutaneous 3 times per day  . vancomycin  250 mg Oral 4 times per day   No results found. BMET    Component Value Date/Time   NA 137 08/04/2013 0615   NA 138 07/16/2013 0839   K 4.5 08/04/2013 0615   CL 106 08/04/2013 0615   CO2 14* 08/04/2013 0615   GLUCOSE 101* 08/04/2013 0615   GLUCOSE 110* 07/16/2013 0839   BUN 57* 08/04/2013 0615   BUN 33* 07/16/2013 0839   CREATININE 3.53* 08/04/2013 0615   CREATININE 2.04* 10/11/2012 0853   CALCIUM 9.2 08/04/2013 0615   GFRNONAA 16* 08/04/2013 0615   GFRNONAA 33* 10/11/2012 0853   GFRAA 19* 08/04/2013 0615   GFRAA 38* 10/11/2012 0853   CBC    Component Value Date/Time   WBC 28.7* 08/04/2013 0615   WBC 24.8* 07/16/2013 0839   WBC 4.2* 10/11/2012 0939   RBC 3.40* 08/04/2013 0615   RBC 3.54* 07/16/2013 0839   RBC 4.8 10/11/2012 0939   HGB 9.6* 08/04/2013 0615   HGB 14.6 10/11/2012 0939   HCT 31.6* 08/04/2013 0615   HCT 41.9* 10/11/2012 0939   PLT 103* 08/04/2013 0615   MCV 92.9 08/04/2013 0615   MCV 87.5 10/11/2012 0939   MCH 28.2 08/04/2013 0615   MCH 28.0 07/16/2013 0839   MCH 30.6 10/11/2012 0939   MCHC 30.4 08/04/2013 0615   MCHC 31.9 07/16/2013 0839   MCHC 34.9 10/11/2012 0939   RDW 16.7* 08/04/2013 0615   RDW 15.9* 07/16/2013 0839   LYMPHSABS 1.2 08/01/2013 0750   LYMPHSABS  3.5* 07/16/2013 0839   MONOABS 7.7* 08/01/2013 0750   EOSABS 0.4 08/01/2013 0750   EOSABS 0.0 07/16/2013 0839   BASOSABS 0.0 08/01/2013 0750   BASOSABS 0.0 07/16/2013 0839     Assessment: 1. Acute on CKD 3 2. C diff colitis 3. Cirrhosis 4. Presumably met a acidosis  Plan: 1. Start PO bicarb 2. Recheck UNa 3. Resume diuretics 4.  Discussed the role of HD and he understands what it is.  I think his quality of life would be very poor if we came to HD   Windy Kalata

## 2013-08-04 NOTE — Progress Notes (Signed)
INFECTIOUS DISEASE PROGRESS NOTE  ID: Michael Bowers is a 68 y.o. male with  Active Problems:   Liver failure  Subjective: Without complaints.  Diarrhea improved.  Cont abd, LE swelling.   Abtx:  Anti-infectives   Start     Dose/Rate Route Frequency Ordered Stop   08/01/13 1630  metroNIDAZOLE (FLAGYL) IVPB 500 mg     500 mg 100 mL/hr over 60 Minutes Intravenous Every 8 hours 08/01/13 1520     07/30/13 1800  vancomycin (VANCOCIN) 50 mg/mL oral solution 250 mg     250 mg Oral 4 times per day 07/30/13 1627        Medications:  Scheduled: . famotidine  20 mg Oral QHS  . metronidazole  500 mg Intravenous Q8H  . midodrine  10 mg Oral TID WC  . multivitamin with minerals  1 tablet Oral Daily  . octreotide  50 mcg Subcutaneous 3 times per day  . vancomycin  250 mg Oral 4 times per day    Objective: Vital signs in last 24 hours: Temp:  [97.5 F (36.4 C)-97.8 F (36.6 C)] 97.5 F (36.4 C) (05/11 0557) Pulse Rate:  [86-97] 86 (05/11 0557) Resp:  [16-18] 16 (05/11 0557) BP: (127-150)/(71-84) 127/71 mmHg (05/11 0557) SpO2:  [96 %-100 %] 96 % (05/11 0557) Weight:  [85.6 kg (188 lb 11.4 oz)] 85.6 kg (188 lb 11.4 oz) (05/11 0557)   General appearance: alert, cooperative and no distress Resp: clear to auscultation bilaterally Cardio: regular rate and rhythm GI: normal findings: bowel sounds normal and abnormal findings:  distended Extremities: edema 3+ ankle  Lab Results  Recent Labs  08/03/13 0305 08/04/13 0615  WBC 31.1* 28.7*  HGB 9.2* 9.6*  HCT 30.1* 31.6*  NA 134* 137  K 4.7 4.5  CL 104 106  CO2 15* 14*  BUN 59* 57*  CREATININE 3.50* 3.53*   Liver Panel  Recent Labs  08/04/13 0615  PROT 7.3  ALBUMIN 4.0  AST 16  ALT 9  ALKPHOS 403*  BILITOT 1.2  BILIDIR 0.7*  IBILI 0.5   Sedimentation Rate No results found for this basename: ESRSEDRATE,  in the last 72 hours C-Reactive Protein No results found for this basename: CRP,  in the last 72  hours  Microbiology: Recent Results (from the past 240 hour(s))  BODY FLUID CULTURE     Status: None   Collection Time    08/01/13  2:14 PM      Result Value Ref Range Status   Specimen Description FLUID ABDOMEN ASCITIC   Final   Special Requests Normal   Final   Gram Stain     Final   Value: WBC PRESENT, PREDOMINANTLY MONONUCLEAR     NO ORGANISMS SEEN     Performed at [facility]   Culture     Final   Value: NO GROWTH 3 DAYS     Performed at [facility]   Report Status 08/04/2013 FINAL   Final    Studies/Results: No results found.   Assessment/Plan: Leukocytosis C diff Hepatorenal syndrome ? SBP- peritoneal fluid had 548 WBC (75% mono)   Cx (-)  Total days of antibiotics: 3 vanco/flagyl  No change in anbx Expect he will be on vanco for prolonged taper.          [facility] (pager) [phone] [email or web address] 08/04/2013, 12:21 PM  LOS: 5 days   **Disclaimer: This note may have been dictated with voice recognition software. Similar  sounding words can inadvertently be transcribed and this note may contain transcription errors which may not have been corrected upon publication of note.**

## 2013-08-05 DIAGNOSIS — I059 Rheumatic mitral valve disease, unspecified: Secondary | ICD-10-CM

## 2013-08-05 LAB — CBC
HCT: 34.2 % — ABNORMAL LOW (ref 39.0–52.0)
Hemoglobin: 10.5 g/dL — ABNORMAL LOW (ref 13.0–17.0)
MCH: 27.9 pg (ref 26.0–34.0)
MCHC: 30.7 g/dL (ref 30.0–36.0)
MCV: 90.7 fL (ref 78.0–100.0)
Platelets: 114 10*3/uL — ABNORMAL LOW (ref 150–400)
RBC: 3.77 MIL/uL — ABNORMAL LOW (ref 4.22–5.81)
RDW: 16.4 % — ABNORMAL HIGH (ref 11.5–15.5)
WBC: 34.3 10*3/uL — ABNORMAL HIGH (ref 4.0–10.5)

## 2013-08-05 LAB — BASIC METABOLIC PANEL
BUN: 59 mg/dL — ABNORMAL HIGH (ref 6–23)
CO2: 14 mEq/L — ABNORMAL LOW (ref 19–32)
CREATININE: 3.36 mg/dL — AB (ref 0.50–1.35)
Calcium: 9.5 mg/dL (ref 8.4–10.5)
Chloride: 106 mEq/L (ref 96–112)
GFR calc Af Amer: 20 mL/min — ABNORMAL LOW (ref >90)
GFR, EST NON AFRICAN AMERICAN: 17 mL/min — AB (ref >90)
Glucose, Bld: 99 mg/dL (ref 70–99)
Potassium: 4.3 mEq/L (ref 3.7–5.3)
SODIUM: 136 meq/L — AB (ref 137–147)

## 2013-08-05 LAB — PROTIME-INR
INR: 1.86 — AB (ref 0.00–1.49)
Prothrombin Time: 20.9 seconds — ABNORMAL HIGH (ref 11.6–15.2)

## 2013-08-05 MED ORDER — FLEET ENEMA 7-19 GM/118ML RE ENEM
1.0000 | ENEMA | Freq: Once | RECTAL | Status: DC
  Filled 2013-08-05: qty 1

## 2013-08-05 NOTE — Progress Notes (Signed)
  Echocardiogram 2D Echocardiogram has been performed.  Carney Corners 08/05/2013, 1:15 PM

## 2013-08-05 NOTE — Progress Notes (Signed)
S: Discussed with pt and daughter the fact that he is in a difficult situation.  I think his cirrhosis is driving his renal insufficiency and since his liver ds is not treatable then I do not think that HD (if things were to get to that point) is a good option.  They agree. O:BP 128/76  Pulse 76  Temp(Src) 97.8 F (36.6 C) (Oral)  Resp 17  Ht _0  (1.702 m)  Wt 86.8 kg (191 lb 5.8 oz)  BMI 29.96 kg/m2  SpO2 97%  Intake/Output Summary (Last 24 hours) at 08/05/13 1039 Last data filed at 08/05/13 0932  Gross per 24 hour  Intake    580 ml  Output    250 ml  Net    330 ml   Weight change: 1.2 kg (2 lb 10.3 oz) QQI:WLNLG and alert CVS:RRR Resp:decreased BS bases Abd:+ BS + distention  + ascites Ext:3-4+ edema NEURO:CNI Ox3 no asterixis   . famotidine  20 mg Oral QHS  . furosemide  160 mg Oral BID  . loperamide  2 mg Oral BID  . metronidazole  500 mg Intravenous Q8H  . midodrine  10 mg Oral TID WC  . multivitamin with minerals  1 tablet Oral Daily  . octreotide  50 mcg Subcutaneous 3 times per day  . sodium bicarbonate  650 mg Oral TID  . [START ON 08/06/2013] sodium phosphate  1 enema Rectal Once  . [START ON 08/06/2013] sodium phosphate  1 enema Rectal Once  . vancomycin  250 mg Oral 4 times per day   No results found. BMET    Component Value Date/Time   NA 136* 08/05/2013 0346   NA 138 07/16/2013 0839   K 4.3 08/05/2013 0346   CL 106 08/05/2013 0346   CO2 14* 08/05/2013 0346   GLUCOSE 99 08/05/2013 0346   GLUCOSE 110* 07/16/2013 0839   BUN 59* 08/05/2013 0346   BUN 33* 07/16/2013 0839   CREATININE 3.36* 08/05/2013 0346   CREATININE 2.04* 10/11/2012 0853   CALCIUM 9.5 08/05/2013 0346   GFRNONAA 17* 08/05/2013 0346   GFRNONAA 33* 10/11/2012 0853   GFRAA 20* 08/05/2013 0346   GFRAA 38* 10/11/2012 0853   CBC    Component Value Date/Time   WBC 34.3* 08/05/2013 0346   WBC 24.8* 07/16/2013 0839   WBC 4.2* 10/11/2012 0939   RBC 3.77* 08/05/2013 0346   RBC 3.54* 07/16/2013 0839   RBC  4.8 10/11/2012 0939   HGB 10.5* 08/05/2013 0346   HGB 14.6 10/11/2012 0939   HCT 34.2* 08/05/2013 0346   HCT 41.9* 10/11/2012 0939   PLT 114* 08/05/2013 0346   MCV 90.7 08/05/2013 0346   MCV 87.5 10/11/2012 0939   MCH 27.9 08/05/2013 0346   MCH 28.0 07/16/2013 0839   MCH 30.6 10/11/2012 0939   MCHC 30.7 08/05/2013 0346   MCHC 31.9 07/16/2013 0839   MCHC 34.9 10/11/2012 0939   RDW 16.4* 08/05/2013 0346   RDW 15.9* 07/16/2013 0839   LYMPHSABS 1.2 08/01/2013 0750   LYMPHSABS 3.5* 07/16/2013 0839   MONOABS 7.7* 08/01/2013 0750   EOSABS 0.4 08/01/2013 0750   EOSABS 0.0 07/16/2013 0839   BASOSABS 0.0 08/01/2013 0750   BASOSABS 0.0 07/16/2013 0839     Assessment: 1. Acute on CKD 3 2. C diff colitis 3. Cirrhosis 4. Presumably met a acidosis  Plan: 1. Cont with lasix. 2. Need to keep I/O's 3.  If TIPS is offered he understands that it may help in  terms of ascites to provide some longer term comfort but it could cause renal fx to worsen and if that were to happen then comfort care would be best option.  He and daughter understand.   Windy Kalata

## 2013-08-05 NOTE — Progress Notes (Signed)
[facility] Progress Note    Since last GI note: Lasix restarted yesterday.  This AM Cr a bit lower. Urinating a lot overnight.  Objective: Vital signs in last 24 hours: Temp:  [97.7 F (36.5 C)-97.9 F (36.6 C)] 97.8 F (36.6 C) (05/12 0429) Pulse Rate:  [76-81] 76 (05/12 0429) Resp:  [16-17] 17 (05/12 0429) BP: (128-135)/(76-80) 128/76 mmHg (05/12 0429) SpO2:  [97 %-98 %] 97 % (05/12 0429) Weight:  [191 lb 5.8 oz (86.8 kg)] 191 lb 5.8 oz (86.8 kg) (05/12 0429) Last BM Date: 08/05/13 General: alert and oriented times 3 Heart: regular rate and rythm Abdomen: tense ascites still EXxt: 1-2+ pitting edema Scrotal edema Weight 191.5 (up 2 pounds since yesterday, about 10 pounds since admission  Lab Results:  Recent Labs  08/03/13 0305 08/04/13 0615 08/05/13 0346  WBC 31.1* 28.7* 34.3*  HGB 9.2* 9.6* 10.5*  PLT 90* 103* 114*  MCV 92.0 92.9 90.7    Recent Labs  08/03/13 0305 08/04/13 0615 08/05/13 0346  NA 134* 137 136*  K 4.7 4.5 4.3  CL 104 106 106  CO2 15* 14* 14*  GLUCOSE 112* 101* 99  BUN 59* 57* 59*  CREATININE 3.50* 3.53* 3.36*  CALCIUM 9.0 9.2 9.5    Recent Labs  08/04/13 0615  PROT 7.3  ALBUMIN 4.0  AST 16  ALT 9  ALKPHOS 403*  BILITOT 1.2  BILIDIR 0.7*  IBILI 0.5    Recent Labs  08/04/13 0615 08/05/13 0346  INR 1.72* 1.86*      Medications: Scheduled Meds: . famotidine  20 mg Oral QHS  . furosemide  160 mg Oral BID  . loperamide  2 mg Oral BID  . metronidazole  500 mg Intravenous Q8H  . midodrine  10 mg Oral TID WC  . multivitamin with minerals  1 tablet Oral Daily  . octreotide  50 mcg Subcutaneous 3 times per day  . sodium bicarbonate  650 mg Oral TID  . vancomycin  250 mg Oral 4 times per day   Continuous Infusions:  PRN Meds:.acetaminophen, acetaminophen, HYDROcodone-acetaminophen, ondansetron (ZOFRAN) IV, ondansetron    Assessment/Plan: 67 y.o. male with cirrhosis (massive ascites), renal insuff  (HRS), elevated WBC  WBC elevation is confusion.  Perhap leukemoid reaction.  I would like to flex sig to check for persistent C. Diff (could explain his WBC and perhaps also his continued hep decompensation). This will be tomorrow AM.  IF no sign of C. Diff, then we will ask hematology to see for his WBC.  Edema, ascites: lasix restarted yesterday 160 bid orally.  Cr not higher today and he has been urinating a lot.  Maybe this will begin to control his edema.  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  I discussed with Dr. Mattingly who agrees this is a very difficult situation and TIPS may be helpful. Data review shows TIPS in setting of HRS is mixed, but some patients note signficant improvement while some do worse. Could be 'reversed' if HRS worsens or encephalopathy sets in.    Michael Bowers P Skyelyn Scruggs, MD  08/05/2013, 9:40 AM [facility] Pager [phone]     

## 2013-08-05 NOTE — Progress Notes (Signed)
INFECTIOUS DISEASE PROGRESS NOTE  ID: Michael Bowers is a 68 y.o. male with  Active Problems:   Liver failure  Subjective: Denies diarrhea  Abtx:  Anti-infectives   Start     Dose/Rate Route Frequency Ordered Stop   08/01/13 1630  metroNIDAZOLE (FLAGYL) IVPB 500 mg     500 mg 100 mL/hr over 60 Minutes Intravenous Every 8 hours 08/01/13 1520     07/30/13 1800  vancomycin (VANCOCIN) 50 mg/mL oral solution 250 mg     250 mg Oral 4 times per day 07/30/13 1627        Medications:  Scheduled: . famotidine  20 mg Oral QHS  . furosemide  160 mg Oral BID  . loperamide  2 mg Oral BID  . metronidazole  500 mg Intravenous Q8H  . midodrine  10 mg Oral TID WC  . multivitamin with minerals  1 tablet Oral Daily  . octreotide  50 mcg Subcutaneous 3 times per day  . sodium bicarbonate  650 mg Oral TID  . vancomycin  250 mg Oral 4 times per day    Objective: Vital signs in last 24 hours: Temp:  [97.7 F (36.5 C)-97.9 F (36.6 C)] 97.8 F (36.6 C) (05/12 0429) Pulse Rate:  [76-81] 76 (05/12 0429) Resp:  [16-17] 17 (05/12 0429) BP: (128-135)/(76-80) 128/76 mmHg (05/12 0429) SpO2:  [97 %-98 %] 97 % (05/12 0429) Weight:  [86.8 kg (191 lb 5.8 oz)] 86.8 kg (191 lb 5.8 oz) (05/12 0429)   General appearance: alert, cooperative and no distress Resp: clear to auscultation bilaterally Cardio: regular rate and rhythm GI: normal findings: bowel sounds normal and abnormal findings:  distended Ankle edema unchanged  Lab Results  Recent Labs  08/04/13 0615 08/05/13 0346  WBC 28.7* 34.3*  HGB 9.6* 10.5*  HCT 31.6* 34.2*  NA 137 136*  K 4.5 4.3  CL 106 106  CO2 14* 14*  BUN 57* 59*  CREATININE 3.53* 3.36*   Liver Panel  Recent Labs  08/04/13 0615  PROT 7.3  ALBUMIN 4.0  AST 16  ALT 9  ALKPHOS 403*  BILITOT 1.2  BILIDIR 0.7*  IBILI 0.5   Sedimentation Rate No results found for this basename: ESRSEDRATE,  in the last 72 hours C-Reactive Protein No results found for  this basename: CRP,  in the last 72 hours  Microbiology: Recent Results (from the past 240 hour(s))  BODY FLUID CULTURE     Status: None   Collection Time    08/01/13  2:14 PM      Result Value Ref Range Status   Specimen Description FLUID ABDOMEN ASCITIC   Final   Special Requests Normal   Final   Gram Stain     Final   Value: WBC PRESENT, PREDOMINANTLY MONONUCLEAR     NO ORGANISMS SEEN     Performed at [facility]   Culture     Final   Value: NO GROWTH 3 DAYS     Performed at [facility]   Report Status 08/04/2013 FINAL   Final    Studies/Results: No results found.   Assessment/Plan: Leukocytosis  C diff  Hepatorenal syndrome  ? SBP- peritoneal fluid had 548 WBC (75% mono)   Cx (-)  Total days of antibiotics: 4 vanco/flagyl C diff seems better.  Would plan for 7 days of flagyl and vanco Then 125mg  bid for 7 days 125mg  qday for 7 days 125mg  qoday for 7 day 125mg  tiw for 2  weeks  available if questions.          [facility] (pager) [phone] [email or web address] 08/05/2013, 11:53 AM  LOS: 6 days   **Disclaimer: This note may have been dictated with voice recognition software. Similar sounding words can inadvertently be transcribed and this note may contain transcription errors which may not have been corrected upon publication of note.**

## 2013-08-06 ENCOUNTER — Inpatient Hospital Stay (HOSPITAL_COMMUNITY): Payer: Medicare Other

## 2013-08-06 ENCOUNTER — Encounter (HOSPITAL_COMMUNITY): Payer: Self-pay | Admitting: *Deleted

## 2013-08-06 ENCOUNTER — Encounter (HOSPITAL_COMMUNITY)
Admission: AD | Disposition: A | Discharge status: Hospice | Payer: Self-pay | Source: Ambulatory Visit | Attending: Internal Medicine

## 2013-08-06 DIAGNOSIS — E638 Other specified nutritional deficiencies: Secondary | ICD-10-CM

## 2013-08-06 DIAGNOSIS — D649 Anemia, unspecified: Secondary | ICD-10-CM

## 2013-08-06 DIAGNOSIS — D696 Thrombocytopenia, unspecified: Secondary | ICD-10-CM

## 2013-08-06 DIAGNOSIS — K769 Liver disease, unspecified: Secondary | ICD-10-CM

## 2013-08-06 DIAGNOSIS — R599 Enlarged lymph nodes, unspecified: Secondary | ICD-10-CM

## 2013-08-06 HISTORY — PX: FLEXIBLE SIGMOIDOSCOPY: SHX5431

## 2013-08-06 LAB — CBC
HCT: 33.4 % — ABNORMAL LOW (ref 39.0–52.0)
Hemoglobin: 10.5 g/dL — ABNORMAL LOW (ref 13.0–17.0)
MCH: 28.5 pg (ref 26.0–34.0)
MCHC: 31.4 g/dL (ref 30.0–36.0)
MCV: 90.5 fL (ref 78.0–100.0)
Platelets: 112 10*3/uL — ABNORMAL LOW (ref 150–400)
RBC: 3.69 MIL/uL — ABNORMAL LOW (ref 4.22–5.81)
RDW: 16.6 % — ABNORMAL HIGH (ref 11.5–15.5)
WBC: 42.5 10*3/uL — ABNORMAL HIGH (ref 4.0–10.5)

## 2013-08-06 LAB — BODY FLUID CELL COUNT WITH DIFFERENTIAL
EOS FL: NONE SEEN %
Lymphs, Fluid: 25 %
Monocyte-Macrophage-Serous Fluid: 57 % (ref 50–90)
Neutrophil Count, Fluid: 18 % (ref 0–25)
Total Nucleated Cell Count, Fluid: 666 cu mm (ref 0–1000)

## 2013-08-06 LAB — SAVE SMEAR

## 2013-08-06 LAB — BASIC METABOLIC PANEL
BUN: 61 mg/dL — ABNORMAL HIGH (ref 6–23)
CALCIUM: 9.6 mg/dL (ref 8.4–10.5)
CO2: 16 mEq/L — ABNORMAL LOW (ref 19–32)
CREATININE: 3.61 mg/dL — AB (ref 0.50–1.35)
Chloride: 107 mEq/L (ref 96–112)
GFR calc non Af Amer: 16 mL/min — ABNORMAL LOW (ref >90)
GFR, EST AFRICAN AMERICAN: 18 mL/min — AB (ref >90)
GLUCOSE: 108 mg/dL — AB (ref 70–99)
POTASSIUM: 4.3 meq/L (ref 3.7–5.3)
Sodium: 138 mEq/L (ref 137–147)

## 2013-08-06 SURGERY — SIGMOIDOSCOPY, FLEXIBLE
Anesthesia: Moderate Sedation

## 2013-08-06 MED ORDER — FENTANYL CITRATE 0.05 MG/ML IJ SOLN
INTRAMUSCULAR | Status: DC | PRN
  Administered 2013-08-06 (×2): 25 ug via INTRAVENOUS

## 2013-08-06 MED ORDER — MIDAZOLAM HCL 10 MG/2ML IJ SOLN
INTRAMUSCULAR | Status: DC | PRN
  Administered 2013-08-06: 2 mg via INTRAVENOUS

## 2013-08-06 MED ORDER — FENTANYL CITRATE 0.05 MG/ML IJ SOLN
INTRAMUSCULAR | Status: AC
  Filled 2013-08-06: qty 4

## 2013-08-06 MED ORDER — MIDAZOLAM HCL 5 MG/ML IJ SOLN
INTRAMUSCULAR | Status: AC
  Filled 2013-08-06: qty 2

## 2013-08-06 NOTE — Procedures (Addendum)
US guided para  90 cc blood tinged fluid Diagnostic only per MD Sent for labs  Pt tolerated well

## 2013-08-06 NOTE — Progress Notes (Signed)
Patient continent of small amount soft yellow stool after tap water enema administered at 0615.

## 2013-08-06 NOTE — Progress Notes (Addendum)
Flex sig today does not explain WBC elevation.  Cr up a bit since yesterday.  Planning on repeat diagnostic paracentesis to make sure no SBP here. Planning on hematology consult for elevated WBC.  If he does not have SBP and does not have primary hematologic issue, then will probably proceed with TIPS.  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      This afternoon, at family's request, I met with several family members while Adante was getting paracentesis.  I explained overall situation (ARF, cirrhosis).  Discussed palliative care option (which could include periodic LV paracentesis) vs TIPS.  They understand that TIPS is usually helpful for refractory ascites but given his CR 3.5 the data is less clear.  He may improve, he may worsen.  Awaiting paracentesis results, hematology input.  If unrevealing, then family is considering palliative care vs. TIPS.

## 2013-08-06 NOTE — Interval H&P Note (Signed)
History and Physical Interval Note:  08/06/2013 9:52 AM  Michael Bowers  has presented today for surgery, with the diagnosis of diarrhea and C diff.  The various methods of treatment have been discussed with the patient and family. After consideration of risks, benefits and other options for treatment, the patient has consented to  Procedure(s): FLEXIBLE SIGMOIDOSCOPY (N/A) as a surgical intervention .  The patient's history has been reviewed, patient examined, no change in status, stable for surgery.  I have reviewed the patient's chart and labs.  Questions were answered to the patient's satisfaction.     Milus Banister

## 2013-08-06 NOTE — Op Note (Signed)
[facility] [address] [place], [address]   FLEX SIGMOIDOSCOPY PROCEDURE REPORT  PATIENT: Michael Bowers, Michael Bowers  MR#: [id number] BIRTHDATE: 05/11/1945 , 67  yrs. old GENDER: Male ENDOSCOPIST: Milus Banister, MD PROCEDURE DATE:  08/06/2013 PROCEDURE:   Sigmoidoscopy, diagnostic INDICATIONS:diarrhea, very elevated WBC, previously C.  diff by PCR positive. MEDICATIONS: Fentanyl 50 mcg IV and Versed 4 mg IV  DESCRIPTION OF PROCEDURE:    Physical exam was performed.  Informed consent was obtained from the patient after explaining the benefits, risks, and alternatives to procedure.  The patient was connected to monitor and placed in left lateral position. Continuous oxygen was provided by nasal cannula and IV medicine administered through an indwelling cannula.  After administration of sedation and rectal exam, the patients rectum was intubated and the EC-3890Li ([id number])  colonoscope was advanced under direct visualization to the cecum.  The scope was removed slowly by carefully examining the color, texture, anatomy, and integrity mucosa on the way out.  The patient was recovered in endoscopy and discharged home in satisfactory condition.   COLON FINDINGS: The colonic mucosa appeared normal to the cecum. There a few scattered flecks of yellow, floating debris that was similar to C diff but none of this was adherent to mucosa and the underlying mucosa was completely normal. PREP QUALITY: adequate  CECAL W/D TIME: NA  COMPLICATIONS: None  ENDOSCOPIC IMPRESSION: The colonic mucosa appeared normal.   RECOMMENDATIONS: These findings do not explain his persistently very elevated WBC. Should continue full previously started antibiotic course for C. diff + PCR diarrhea.  We will repeat diagnostic paracentesis today and will also ask hematology to see for very elevated WBC.   _______________________________ eSigned:  Milus Banister, MD 08/06/2013  10:25 AM

## 2013-08-06 NOTE — Consult Note (Signed)
Michael Bowers  Telephone:([phone]) [place] NOTE I have seen the patient, examined him and edited the notes as follows  Michael Bowers                                MR#: [id number]  DOB: 04-27-1945                       CSN#: [id number]  Referring MD: Dr. Dr. Hilarie Fredrickson, [facility] Primary MD: Dr.MOORE, Elenore Rota, MD Consulting Physcian: Heath Lark, MD   Reason for Consult: Leukocytosis  VEL:FYBOF Michael Bowers is a 68 y.o. male and I am asked to see him for evaluation of leukocytosis.   In review, he carries a history of cryptogenic cirrhosis.   I reviewed his chart extensively He initially presented to his PCP at the end of February for sensation of bloating and abdominal swelling. Imaging studies confirmed splenomegaly and non-specific lymphadenopathy. He was referred to GI service for further evaluation. He had endoscopy which confirmed portal hypertension with gastropathy. Colonoscopy revealed polyps which were resected and pathology was benign. Soon after the colonoscopy, he developed severe diarrhea and tested positive for C Difficile Colitis.  He was treated with Flagyl and then his treatment is switched to Vancomycin. In addition, he had undergone several paracenteses up to 4/30 for ascites.  He was admitted on 5/6 with decompensated cirrhosis and acute on chronic renal failure, suspicious for hepatorenal syndrome.  He has poor nutritional status. He had diarrhea  Which has not resolved. He is afebrile without any chills or night sweats. No significant respiratory or cardiac complaints. No mental status changes.  Patient denies any history or family history of leukemia or other bone marrow disorders. Never had a bone marrow biopsy in the past.  He was noted to have leukocytosis back in March of 2015 at 15.7, suspected to be reactive. In late April it rose to 26,000, with slight decrease while on antibiotics. On admission, this leukocytosis became  more evident, ,from 30 on 5/10 to 42.5 today, for which we were asked to see patient in consultation to rule out a bone marrow process. Last differential was on 5/8 with ANC of 29.3 for total WBC of 38.6; Monocytes were 7.7, lymphs 1.2  HIV is non reactive and Hepatitis panel is negative. Of note, his platelet count is low as well but slowly improving, today at 112k without any bleeding issues in the setting of liver disease.       PMH:  Past Medical History  Diagnosis Date  . HTN (hypertension)   . CAD (coronary artery disease)     [name];  [name], no ischemia, [name]  . CKD (chronic kidney disease)   . ED (erectile dysfunction)   . Colon polyps   . Cirrhosis     cryptogennic  . Hepatomegaly   . Ascites     [name] 07/30/2013  . GERD (gastroesophageal reflux disease)     Surgeries:  Past Surgical History  Procedure Laterality Date  . Colonoscopy  10/2007    Dr. Amedeo Plenty, inflammatory polyp  . Paracentesis      "several"/notes 07/30/2013  . Coronary angioplasty with stent placement  02/2000    1 stent/notes 02/28/2000    Allergies: No Known Allergies  Medications:  Scheduled Meds: . famotidine  20 mg Oral QHS  . furosemide  160 mg Oral BID  . loperamide  2 mg Oral BID  . metronidazole  500 mg Intravenous Q8H  . midodrine  10 mg Oral TID WC  . multivitamin with minerals  1 tablet Oral Daily  . octreotide  50 mcg Subcutaneous 3 times per day  . sodium bicarbonate  650 mg Oral TID  . vancomycin  250 mg Oral 4 times per day   Continuous Infusions:  PRN Meds:.acetaminophen, acetaminophen, HYDROcodone-acetaminophen, ondansetron (ZOFRAN) IV, ondansetron  ROS: Constitutional: Denies fevers, chills or abnormal night sweats Eyes: Denies blurriness of vision, double vision or watery eyes Ears, nose, mouth, throat, and face: Denies mucositis or sore throat Respiratory: Denies cough,  dyspnea or wheezes Cardiovascular: Denies palpitations, chest discomfort but he does complain of  lower extremity swelling Gastrointestinal: Denies nausea, heartburn, but has lose bowel movements cared by GI service. He has abdominal distention due to ascites (no malignancy identified per path report on last paracentesis) Skin: Denies abnormal skin rashes Lymphatics: Denies new lymphadenopathy or easy bruising Neurological: Denies numbness, tingling or new weaknesses Behavioral/Psych: Mood is stable, no new changes  All other systems were reviewed with the patient and are negative.   Family History:    Family History  Problem Relation Age of Onset  . Colon cancer Neg Hx   . Colon polyps Neg Hx   . Liver disease Neg Hx   . Hypertension Father   . Hypertension Sister   . Hypertension Brother   . Hypertension Sister   . Hypertension Sister   . Hypertension Sister   . Hypertension Sister   . Hypertension Brother   . Hypertension Brother     No family history of hematological  Disorders such as leukemia or lymphoma.  Social History: History   Social History  . Marital Status: Widowed    Spouse Name: N/A    Number of Children: 55  . Years of Education: N/A   Occupational History  . retired    Social History Main Topics  . Smoking status: Never Smoker   . Smokeless tobacco: Never Used  . Alcohol Use: No  . Drug Use: No  . Sexual Activity: Yes   Other Topics Concern  . Not on file   Social History Narrative   Divorced and lives with girlfriend.    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     Daily caffeine    Physical Exam     Filed Vitals:   08/06/13 1133  BP: 100/54  Pulse: 76  Temp: 97.4 F (36.3 C)  Resp: 10   Filed Weights   08/04/13 0557 08/05/13 0429 08/06/13 0531  Weight: 188 lb 11.4 oz (85.6 kg) 191 lb 5.8 oz (86.8 kg) 185 lb 3 oz (84 kg)    GENERAL: 68 year old African American male alert, no distress but appears uncomfortable due to abdominal  distention. He is ill appearing. SKIN: skin color, texture, turgor are normal, no rashes or significant lesions EYES: normal, conjunctiva are pink and non-injected, sclera clear OROPHARYNX:no exudate, no erythema and lips, buccal mucosa, and tongue normal  NECK: supple, thyroid normal size, non-tender, without nodularity LYMPH:  no palpable lymphadenopathy in the cervical, axillary or inguinal area LUNGS: clear to auscultation and percussion with normal breathing effort HEART: regular rate & rhythm and no murmurs and 2+ bilateral lower extremity edema ABDOMEN: abdomen distended and tense due to ascites, non-tender and  active bowel sounds Musculoskeletal: no cyanosis of digits and no clubbing  PSYCH: alert & oriented x 3 with fluent speech NEURO: no focal motor/sensory deficits   Labs:  CBC   Recent Labs Lab 07/30/13 1434  08/01/13 0750 08/02/13 0613 08/03/13 0305 08/04/13 0615 08/05/13 0346 08/06/13 0515  WBC 22.8 Repeated and verified X2.*  < > 38.6* 30.2* 31.1* 28.7* 34.3* 42.5*  HGB 10.1*  < > 9.5* 8.9* 9.2* 9.6* 10.5* 10.5*  HCT 30.7*  < > 31.2* 28.9* 30.1* 31.6* 34.2* 33.4*  PLT 89.0*  < > 82* 84* 90* 103* 114* 112*  MCV 87.9  < > 92.6 92.3 92.0 92.9 90.7 90.5  MCH  --   < > 28.2 28.4 28.1 28.2 27.9 28.5  MCHC 32.9  < > 30.4 30.8 30.6 30.4 30.7 31.4  RDW 17.5*  < > 16.4* 16.5* 16.4* 16.7* 16.4* 16.6*  LYMPHSABS 1.6  --  1.2  --   --   --   --   --   MONOABS 7.0*  --  7.7*  --   --   --   --   --   EOSABS 0.2  --  0.4  --   --   --   --   --   BASOSABS 0.3*  --  0.0  --   --   --   --   --   < > = values in this interval not displayed.   CMP    Recent Labs Lab 08/01/13 0750 08/02/13 0613 08/03/13 0305 08/04/13 0615 08/05/13 0346 08/06/13 0515  NA 136* 137 134* 137 136* 138  K 5.4* 5.1 4.7 4.5 4.3 4.3  CL 104 106 104 106 106 107  CO2 14* 15* 15* 14* 14* 16*  GLUCOSE 119* 111* 112* 101* 99 108*  BUN 61* 62* 59* 57* 59* 61*  CREATININE 3.23* 3.42* 3.50*  3.53* 3.36* 3.61*  CALCIUM 9.1 9.1 9.0 9.2 9.5 9.6  AST 13  --   --  16  --   --   ALT 8  --   --  9  --   --   ALKPHOS 453*  --   --  403*  --   --   BILITOT 0.9  --   --  1.2  --   --         Component Value Date/Time   BILITOT 1.2 08/04/2013 0615   BILIDIR 0.7* 08/04/2013 0615   IBILI 0.5 08/04/2013 0615      Recent Labs Lab 08/01/13 0750 08/02/13 0613 08/03/13 0305 08/04/13 0615 08/05/13 0346  INR 1.53* 1.64* 1.67* 1.72* 1.86*    No results found for this basename: DDIMER,  in the last 72 hours   Anemia panel:   Recent Labs  08/04/13 0615  FERRITIN 237  TIBC 128*  IRON 33*     Imaging Studies: I have reviewed the imaging studies  Dg Chest 2 View  07/30/2013   CLINICAL DATA:  Lower extremity swelling, leukocytosis  EXAM: CHEST  2 VIEW  COMPARISON:  No comparisons  FINDINGS: The heart size and mediastinal contours are within normal limits. Both lungs are clear. The visualized skeletal structures are unremarkable.  IMPRESSION: No active cardiopulmonary disease.   Electronically Signed   By: Kathreen Devoid   On: 07/30/2013 19:59   I have also reviewed his CT scan of the abdomen from March 2015. I have reviewed the peripheral smear which showed predominant neutrophilia with mild monocytosis.  Assessment/Plan:  1. Leukocytosis This is compatible with leukemoid reaction on someone who was recently treated for C. Diff infection. From my previous experience, I have patients with WBC >80,000 which subsequently resolves when the infection is under controlled. There may be a slight delay between the clinical response to antibiotics to the time of WBC reduction.  2. Thrombocytopenia His thrombocytopenia may be related to cryptogenic cirrhosis, acute infection, splenomegaly and IV antibiotics.   3. Anemia Anemia likely of acute on chronic disease and  renal failure. No bleeding issues reported. Will continue to monitor closely. No transfusion indicated at this time.   4.  Non-specific lymphadenopathy on recent CT scan Difficult to interpret without IV contrast. Recommend follow-up imaging in the future  2. Other medical issues as per admitting team and specialties. Thank you for the referral  Michael Jumbo, PA-C 08/06/2013 12:20 PM  Heath Lark, MD 08/06/2013

## 2013-08-06 NOTE — H&P (View-Only) (Signed)
[facility] Progress Note    Since last GI note: Lasix restarted yesterday.  This AM Cr a bit lower. Urinating a lot overnight.  Objective: Vital signs in last 24 hours: Temp:  [97.7 F (36.5 C)-97.9 F (36.6 C)] 97.8 F (36.6 C) (05/12 0429) Pulse Rate:  [76-81] 76 (05/12 0429) Resp:  [16-17] 17 (05/12 0429) BP: (128-135)/(76-80) 128/76 mmHg (05/12 0429) SpO2:  [97 %-98 %] 97 % (05/12 0429) Weight:  [191 lb 5.8 oz (86.8 kg)] 191 lb 5.8 oz (86.8 kg) (05/12 0429) Last BM Date: 08/05/13 General: alert and oriented times 3 Heart: regular rate and rythm Abdomen: tense ascites still EXxt: 1-2+ pitting edema Scrotal edema Weight 191.5 (up 2 pounds since yesterday, about 10 pounds since admission  Lab Results:  Recent Labs  08/03/13 0305 08/04/13 0615 08/05/13 0346  WBC 31.1* 28.7* 34.3*  HGB 9.2* 9.6* 10.5*  PLT 90* 103* 114*  MCV 92.0 92.9 90.7    Recent Labs  08/03/13 0305 08/04/13 0615 08/05/13 0346  NA 134* 137 136*  K 4.7 4.5 4.3  CL 104 106 106  CO2 15* 14* 14*  GLUCOSE 112* 101* 99  BUN 59* 57* 59*  CREATININE 3.50* 3.53* 3.36*  CALCIUM 9.0 9.2 9.5    Recent Labs  08/04/13 0615  PROT 7.3  ALBUMIN 4.0  AST 16  ALT 9  ALKPHOS 403*  BILITOT 1.2  BILIDIR 0.7*  IBILI 0.5    Recent Labs  08/04/13 0615 08/05/13 0346  INR 1.72* 1.86*      Medications: Scheduled Meds: . famotidine  20 mg Oral QHS  . furosemide  160 mg Oral BID  . loperamide  2 mg Oral BID  . metronidazole  500 mg Intravenous Q8H  . midodrine  10 mg Oral TID WC  . multivitamin with minerals  1 tablet Oral Daily  . octreotide  50 mcg Subcutaneous 3 times per day  . sodium bicarbonate  650 mg Oral TID  . vancomycin  250 mg Oral 4 times per day   Continuous Infusions:  PRN Meds:.acetaminophen, acetaminophen, HYDROcodone-acetaminophen, ondansetron (ZOFRAN) IV, ondansetron    Assessment/Plan: 68 y.o. male with cirrhosis (massive ascites), renal insuff  (HRS), elevated WBC  WBC elevation is confusion.  Perhap leukemoid reaction.  I would like to flex sig to check for persistent C. Diff (could explain his WBC and perhaps also his continued hep decompensation). This will be tomorrow AM.  IF no sign of C. Diff, then we will ask hematology to see for his WBC.  Edema, ascites: lasix restarted yesterday 160 bid orally.  Cr not higher today and he has been urinating a lot.  Maybe this will begin to control his edema.  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  I discussed with Dr. Mercy Moore who agrees this is a very difficult situation and TIPS may be helpful. Data review shows TIPS in setting of HRS is mixed, but some patients note signficant improvement while some do worse. Could be 'reversed' if HRS worsens or encephalopathy sets in.    Milus Banister, MD  08/05/2013, 9:40 AM [facility] Pager [phone]

## 2013-08-06 NOTE — Progress Notes (Signed)
S: CO scrotal edema. O:BP 177/101  Pulse 86  Temp(Src) 97.9 F (36.6 C) (Oral)  Resp 15  Ht $R'5\' 7"'ie$  (1.702 m)  Wt 84 kg (185 lb 3 oz)  BMI 29.00 kg/m2  SpO2 97%  Intake/Output Summary (Last 24 hours) at 08/06/13 0929 Last data filed at 08/06/13 0700  Gross per 24 hour  Intake    580 ml  Output    305 ml  Net    275 ml   Weight change: -2.8 kg (-6 lb 2.8 oz) QXI:HWTUU and alert CVS:RRR Resp:decreased BS bases Abd:+ BS + distention  + ascites Ext:3-4+ edema GU + scrotal edema NEURO:CNI Ox3 no asterixis   . famotidine  20 mg Oral QHS  . furosemide  160 mg Oral BID  . loperamide  2 mg Oral BID  . metronidazole  500 mg Intravenous Q8H  . midodrine  10 mg Oral TID WC  . multivitamin with minerals  1 tablet Oral Daily  . octreotide  50 mcg Subcutaneous 3 times per day  . sodium bicarbonate  650 mg Oral TID  . vancomycin  250 mg Oral 4 times per day   No results found. BMET    Component Value Date/Time   NA 138 08/06/2013 0515   NA 138 07/16/2013 0839   K 4.3 08/06/2013 0515   CL 107 08/06/2013 0515   CO2 16* 08/06/2013 0515   GLUCOSE 108* 08/06/2013 0515   GLUCOSE 110* 07/16/2013 0839   BUN 61* 08/06/2013 0515   BUN 33* 07/16/2013 0839   CREATININE 3.61* 08/06/2013 0515   CREATININE 2.04* 10/11/2012 0853   CALCIUM 9.6 08/06/2013 0515   GFRNONAA 16* 08/06/2013 0515   GFRNONAA 33* 10/11/2012 0853   GFRAA 18* 08/06/2013 0515   GFRAA 38* 10/11/2012 0853   CBC    Component Value Date/Time   WBC 42.5* 08/06/2013 0515   WBC 24.8* 07/16/2013 0839   WBC 4.2* 10/11/2012 0939   RBC 3.69* 08/06/2013 0515   RBC 3.54* 07/16/2013 0839   RBC 4.8 10/11/2012 0939   HGB 10.5* 08/06/2013 0515   HGB 14.6 10/11/2012 0939   HCT 33.4* 08/06/2013 0515   HCT 41.9* 10/11/2012 0939   PLT 112* 08/06/2013 0515   MCV 90.5 08/06/2013 0515   MCV 87.5 10/11/2012 0939   MCH 28.5 08/06/2013 0515   MCH 28.0 07/16/2013 0839   MCH 30.6 10/11/2012 0939   MCHC 31.4 08/06/2013 0515   MCHC 31.9 07/16/2013 0839   MCHC 34.9  10/11/2012 0939   RDW 16.6* 08/06/2013 0515   RDW 15.9* 07/16/2013 0839   LYMPHSABS 1.2 08/01/2013 0750   LYMPHSABS 3.5* 07/16/2013 0839   MONOABS 7.7* 08/01/2013 0750   EOSABS 0.4 08/01/2013 0750   EOSABS 0.0 07/16/2013 0839   BASOSABS 0.0 08/01/2013 0750   BASOSABS 0.0 07/16/2013 0839     Assessment: 1. Acute on CKD 3.  UO not accurate as pt not saving all his urine 2. C diff colitis   3. Cirrhosis 4. Presumably met a acidosis 5. Leukocytosis  Plan: 1. Note plan for diagnostic paracentesis 2. Cont lasix 3. Overall. I do not think his prognosis is very good Michael Bowers

## 2013-08-07 ENCOUNTER — Encounter (HOSPITAL_COMMUNITY): Payer: Self-pay | Admitting: Certified Registered"

## 2013-08-07 ENCOUNTER — Encounter (HOSPITAL_COMMUNITY): Payer: Self-pay | Admitting: Gastroenterology

## 2013-08-07 LAB — CBC
HEMATOCRIT: 30.3 % — AB (ref 39.0–52.0)
Hemoglobin: 9.5 g/dL — ABNORMAL LOW (ref 13.0–17.0)
MCH: 28.3 pg (ref 26.0–34.0)
MCHC: 31.4 g/dL (ref 30.0–36.0)
MCV: 90.2 fL (ref 78.0–100.0)
Platelets: 107 10*3/uL — ABNORMAL LOW (ref 150–400)
RBC: 3.36 MIL/uL — ABNORMAL LOW (ref 4.22–5.81)
RDW: 16.6 % — AB (ref 11.5–15.5)
WBC: 39.5 10*3/uL — ABNORMAL HIGH (ref 4.0–10.5)

## 2013-08-07 LAB — TYPE AND SCREEN
ABO/RH(D): O POS
Antibody Screen: NEGATIVE

## 2013-08-07 LAB — BASIC METABOLIC PANEL
BUN: 64 mg/dL — ABNORMAL HIGH (ref 6–23)
CO2: 13 meq/L — AB (ref 19–32)
CREATININE: 3.88 mg/dL — AB (ref 0.50–1.35)
Calcium: 9.3 mg/dL (ref 8.4–10.5)
Chloride: 104 mEq/L (ref 96–112)
GFR calc non Af Amer: 15 mL/min — ABNORMAL LOW (ref >90)
GFR, EST AFRICAN AMERICAN: 17 mL/min — AB (ref >90)
Glucose, Bld: 99 mg/dL (ref 70–99)
Potassium: 4.4 mEq/L (ref 3.7–5.3)
Sodium: 137 mEq/L (ref 137–147)

## 2013-08-07 LAB — ABO/RH: ABO/RH(D): O POS

## 2013-08-07 LAB — PATHOLOGIST SMEAR REVIEW: Path Review: REACTIVE

## 2013-08-07 MED ORDER — KETOROLAC TROMETHAMINE 30 MG/ML IJ SOLN
INTRAMUSCULAR | Status: AC
  Filled 2013-08-07: qty 1

## 2013-08-07 MED ORDER — WHITE PETROLATUM GEL
Status: AC
  Administered 2013-08-07: 0.2
  Filled 2013-08-07: qty 5

## 2013-08-07 MED ORDER — SODIUM BICARBONATE 650 MG PO TABS
1300.0000 mg | ORAL_TABLET | Freq: Two times a day (BID) | ORAL | Status: DC
  Administered 2013-08-07 – 2013-08-09 (×4): 1300 mg via ORAL
  Filled 2013-08-07 (×6): qty 2

## 2013-08-07 NOTE — Progress Notes (Signed)
S: CO scrotal edema. O:BP 130/68  Pulse 81  Temp(Src) 98.1 F (36.7 C) (Oral)  Resp 20  Ht $R'5\' 7"'Xi$  (1.702 m)  Wt 83.6 kg (184 lb 4.9 oz)  BMI 28.86 kg/m2  SpO2 100% No intake or output data in the 24 hours ending 08/07/13 0946 Weight change: -0.4 kg (-14.1 oz) AES:LPNPY and alert CVS:RRR Resp:decreased BS bases Abd:+ BS + distention  + ascites Ext:3-4+ edema GU + scrotal edema NEURO:CNI Ox3 no asterixis   . famotidine  20 mg Oral QHS  . furosemide  160 mg Oral BID  . loperamide  2 mg Oral BID  . metronidazole  500 mg Intravenous Q8H  . midodrine  10 mg Oral TID WC  . multivitamin with minerals  1 tablet Oral Daily  . octreotide  50 mcg Subcutaneous 3 times per day  . sodium bicarbonate  650 mg Oral TID  . vancomycin  250 mg Oral 4 times per day   US Paracentesis  08/06/2013   CLINICAL DATA:  Recurrent abdominal ascites  EXAM: ULTRASOUND GUIDED right lower PARACENTESIS  COMPARISON:  Previous paracentesis  PROCEDURE: An ultrasound guided paracentesis was thoroughly discussed with the patient and questions answered. The benefits, risks, alternatives and complications were also discussed. The patient understands and wishes to proceed with the procedure. Written consent was obtained.  Ultrasound was performed to localize and mark an adequate pocket of fluid in the right lower quadrant of the abdomen. The area was then prepped and draped in the normal sterile fashion. 1% Lidocaine was used for local anesthesia. Under ultrasound guidance a 19 gauge Yueh catheter was introduced. Paracentesis was performed. The catheter was removed and a dressing applied.  Complications: None.  FINDINGS: A total of approximately 90 cc of blood and fluid was removed. A fluid sample was sent for laboratory analysis.  IMPRESSION: Successful ultrasound guided paracentesis yielding 90 cc of ascites.  Diagnostic paracentesis only per MD. Asked to remove as little as possible for lab studies.  Read by: Jannifer Franklin [facility]    Electronically Signed   By: Jacqulynn Cadet M.D.   On: 08/06/2013 16:02   BMET    Component Value Date/Time   NA 137 08/07/2013 0523   NA 138 07/16/2013 0839   K 4.4 08/07/2013 0523   CL 104 08/07/2013 0523   CO2 13* 08/07/2013 0523   GLUCOSE 99 08/07/2013 0523   GLUCOSE 110* 07/16/2013 0839   BUN 64* 08/07/2013 0523   BUN 33* 07/16/2013 0839   CREATININE 3.88* 08/07/2013 0523   CREATININE 2.04* 10/11/2012 0853   CALCIUM 9.3 08/07/2013 0523   GFRNONAA 15* 08/07/2013 0523   GFRNONAA 33* 10/11/2012 0853   GFRAA 17* 08/07/2013 0523   GFRAA 38* 10/11/2012 0853   CBC    Component Value Date/Time   WBC 39.5* 08/07/2013 0523   WBC 24.8* 07/16/2013 0839   WBC 4.2* 10/11/2012 0939   RBC 3.36* 08/07/2013 0523   RBC 3.54* 07/16/2013 0839   RBC 4.8 10/11/2012 0939   HGB 9.5* 08/07/2013 0523   HGB 14.6 10/11/2012 0939   HCT 30.3* 08/07/2013 0523   HCT 41.9* 10/11/2012 0939   PLT 107* 08/07/2013 0523   MCV 90.2 08/07/2013 0523   MCV 87.5 10/11/2012 0939   MCH 28.3 08/07/2013 0523   MCH 28.0 07/16/2013 0839   MCH 30.6 10/11/2012 0939   MCHC 31.4 08/07/2013 0523   MCHC 31.9 07/16/2013 0839   MCHC 34.9 10/11/2012 0939   RDW 16.6* 08/07/2013 0511  RDW 15.9* 07/16/2013 0839   LYMPHSABS 1.2 08/01/2013 0750   LYMPHSABS 3.5* 07/16/2013 0839   MONOABS 7.7* 08/01/2013 0750   EOSABS 0.4 08/01/2013 0750   EOSABS 0.0 07/16/2013 0839   BASOSABS 0.0 08/01/2013 0750   BASOSABS 0.0 07/16/2013 0839     Assessment: 1. Acute on CKD 3.  UO less yest 2. C diff colitis   3. Cirrhosis 4. Presumably met a acidosis 5. Leukocytosis  Plan: 1. GI to discuss role  And timing of TIPS.  He and family aware that it could make renal fx worse and if so HD is not an option. 2. Increase bicarb tabs.  Assuming this is met acidosis.  With high INR it is not worth the risk to do blood gas  Windy Kalata

## 2013-08-07 NOTE — Progress Notes (Signed)
Patient ID: Michael Bowers, male   DOB: [birth year], 68 y.o.   MRN: [id number] Request received for TIPS from GI service on pt with hx of cryptogenic decompensated cirrhosis, refractory ascites/anasarca, portal hypertensive gastropathy,MELD score of 27 and acute on chronic kidney disease ? HRS. Additional PMH as below. Pt recently treated for c diff(vanc) and has elevated WBC which hematology attributes to leukemoid rxn from recent c diff tx. Pt does not have varices by most recent endo but did have candida esophagitis. Echo showed EF of 0000000 1 diastolic dysfxn. Pt is followed by nephrology also and has not undergone dialysis. Imaging studies have been reviewed by Dr. Vernard Gambles and he feels pt is candidate for TIPS. Exam: pt awake/alert; chest- dim BS bases; heart- RRR; abd- dist./tense, +BS, mildly tender; ext- 3-4+ bilat LE edema.   Filed Vitals:   08/06/13 1535 08/06/13 2136 08/07/13 0615 08/07/13 1338  BP: 126/69 129/75 130/68 136/78  Pulse:  80 81 73  Temp:  97.9 F (36.6 C) 98.1 F (36.7 C) 97.6 F (36.4 C)  TempSrc:  Oral Oral Axillary  Resp:  18 20 16   Height:      Weight:   184 lb 4.9 oz (83.6 kg)   SpO2:  98% 100% 97%   Past Medical History  Diagnosis Date  . HTN (hypertension)   . CAD (coronary artery disease)     [name];  [name], no ischemia, [name]  . CKD (chronic kidney disease)   . ED (erectile dysfunction)   . Colon polyps   . Cirrhosis     cryptogennic  . Hepatomegaly   . Ascites     [name] 07/30/2013  . GERD (gastroesophageal reflux disease)    Past Surgical History  Procedure Laterality Date  . Colonoscopy  10/2007    Dr. Amedeo Plenty, inflammatory polyp  . Paracentesis      "several"/notes 07/30/2013  . Coronary angioplasty with stent placement  02/2000    1 stent/notes 02/28/2000  . Flexible sigmoidoscopy N/A 08/06/2013    Procedure: FLEXIBLE SIGMOIDOSCOPY;  Surgeon:  Milus Banister, MD;  Location: [facility];  Service: Endoscopy;  Laterality: N/A;   Dg Chest 2 View  07/30/2013   CLINICAL DATA:  Lower extremity swelling, leukocytosis  EXAM: CHEST  2 VIEW  COMPARISON:  No comparisons  FINDINGS: The heart size and mediastinal contours are within normal limits. Both lungs are clear. The visualized skeletal structures are unremarkable.  IMPRESSION: No active cardiopulmonary disease.   Electronically Signed   By: Kathreen Devoid   On: 07/30/2013 19:59   US Paracentesis  08/06/2013   CLINICAL DATA:  Recurrent abdominal ascites  EXAM: ULTRASOUND GUIDED right lower PARACENTESIS  COMPARISON:  Previous paracentesis  PROCEDURE: An ultrasound guided paracentesis was thoroughly discussed with the patient and questions answered. The benefits, risks, alternatives and complications were also discussed. The patient understands and wishes to proceed with the procedure. Written consent was obtained.  Ultrasound was performed to localize and mark an adequate pocket of fluid in the right lower quadrant of the abdomen. The area was then prepped and draped in the normal sterile fashion. 1% Lidocaine was used for local anesthesia. Under ultrasound guidance a 19 gauge Yueh catheter was introduced. Paracentesis was performed. The catheter was removed and a dressing applied.  Complications: None.  FINDINGS: A total of approximately 90 cc of blood and fluid was removed. A fluid  sample was sent for laboratory analysis.  IMPRESSION: Successful ultrasound guided paracentesis yielding 90 cc of ascites.  Diagnostic paracentesis only per MD. Asked to remove as little as possible for lab studies.  Read by: Jannifer Franklin [facility]   Electronically Signed   By: Jacqulynn Cadet M.D.   On: 08/06/2013 16:02   US Paracentesis  08/01/2013   CLINICAL DATA:  Abdominal ascites ; renal failure  EXAM: ULTRASOUND GUIDED left lower quadrant PARACENTESIS  COMPARISON:  None.  PROCEDURE: An ultrasound guided paracentesis was  thoroughly discussed with the patient and questions answered. The benefits, risks, alternatives and complications were also discussed. The patient understands and wishes to proceed with the procedure. Written consent was obtained.  Ultrasound was performed to localize and mark an adequate pocket of fluid in the left lower quadrant of the abdomen. The area was then prepped and draped in the normal sterile fashion. 1% Lidocaine was used for local anesthesia. Under ultrasound guidance a 19 gauge Yueh catheter was introduced. Paracentesis was performed. The catheter was removed and a dressing applied.  Complications: None.  FINDINGS: A total of approximately 180 cc of cloudy yellow fluid was removed. A fluid sample was sent for laboratory analysis.  IMPRESSION: Successful ultrasound guided paracentesis yielding 180 cc of ascites.  Read by: Jannifer Franklin [facility]   Electronically Signed   By: Arne Cleveland M.D.   On: 08/01/2013 14:36   US Paracentesis  07/24/2013   CLINICAL DATA:  Recurrent ascites  EXAM: ULTRASOUND GUIDED PARACENTESIS  COMPARISON:  Previous paracentesis.  PROCEDURE: An ultrasound guided paracentesis was thoroughly discussed with the patient and questions answered. The benefits, risks, alternatives and complications were also discussed. The patient understands and wishes to proceed with the procedure. Written consent was obtained.  Ultrasound was performed to localize and mark an adequate pocket of fluid in the.left lower. quadrant of the abdomen. The area was then prepped and draped in the normal sterile fashion. 1% Lidocaine was used for local anesthesia. Under ultrasound guidance a 19 gauge Yueh catheter was introduced. Paracentesis was performed. The catheter was removed and a dressing applied.  COMPLICATIONS: None immediate  FINDINGS: A total of approximately.6.5 liters. of.clear yellow. fluid was removed. A fluid sample was not sent for laboratory analysis.  IMPRESSION: Successful ultrasound guided  paracentesis yielding.6.5 liters. of ascites.  Read by Ascencion Dike PA-C   Electronically Signed   By: Markus Daft M.D.   On: 07/24/2013 12:03   US Paracentesis  07/15/2013   CLINICAL DATA:  Abdominal ascites  EXAM: ULTRASOUND GUIDED right lower quadrant PARACENTESIS  COMPARISON:  None.  PROCEDURE: An ultrasound guided paracentesis was thoroughly discussed with the patient and questions answered. The benefits, risks, alternatives and complications were also discussed. The patient understands and wishes to proceed with the procedure. Written consent was obtained.  Ultrasound was performed to localize and mark an adequate pocket of fluid in the right lower quadrant of the abdomen. The area was then prepped and draped in the normal sterile fashion. 1% Lidocaine was used for local anesthesia. Under ultrasound guidance a 19 gauge Yueh catheter was introduced. Paracentesis was performed. The catheter was removed and a dressing applied.  Complications: None.  FINDINGS: A total of approximately 6.2 L of yellow fluid was removed. A fluid sample was sent for laboratory analysis.  IMPRESSION: Successful ultrasound guided paracentesis yielding 6.2 L of ascites. IV albumin her MD postprocedure.  Read by: Jannifer Franklin [facility]   Electronically Signed   By: Sandi Mariscal  M.D.   On: 07/15/2013 12:39   Dg Abd 2 Views  07/24/2013   CLINICAL DATA:  Cirrhosis.  Distended abdomen.  EXAM: ABDOMEN - 2 VIEW  COMPARISON:  Ultrasound, 07/15/2013.  CT, 05/29/2013.  FINDINGS: Normal bowel gas pattern. No evidence of obstruction or generalized adynamic ileus.  No free air.  Soft tissue planes are somewhat indistinct, which is likely from the known ascites.  Disk degenerative changes of the lower lumbar spine. No osteoblastic or osteolytic lesions.  IMPRESSION: No acute findings. No obstruction, free air or generalized adynamic ileus.   Electronically Signed   By: Lajean Manes M.D.   On: 07/24/2013 09:01  Results for orders placed during the  hospital encounter of 07/30/13  BODY FLUID CULTURE      Result Value Ref Range   Specimen Description FLUID ABDOMEN ASCITIC     Special Requests Normal     Gram Stain       Value: WBC PRESENT, PREDOMINANTLY MONONUCLEAR     NO ORGANISMS SEEN     Performed at [facility]   Culture       Value: NO GROWTH 3 DAYS     Performed at [facility]   Report Status 08/04/2013 FINAL    BODY FLUID CULTURE      Result Value Ref Range   Specimen Description ASCITIC ABDOMEN FLUID     Special Requests 60ML FLUID     Gram Stain       Value: WBC PRESENT, PREDOMINANTLY PMN     NO ORGANISMS SEEN     Performed at [facility]   Culture       Value: NO GROWTH 1 DAY     Performed at [facility]   Report Status PENDING    PROTIME-INR      Result Value Ref Range   Prothrombin Time 16.3 (*) 11.6 - 15.2 seconds   INR 1.34  0.00 - 1.49  CBC      Result Value Ref Range   WBC 25.3 (*) 4.0 - 10.5 K/uL   RBC 3.44 (*) 4.22 - 5.81 MIL/uL   Hemoglobin 9.6 (*) 13.0 - 17.0 g/dL   HCT 31.4 (*) 39.0 - 52.0 %   MCV 91.3  78.0 - 100.0 fL   MCH 27.9  26.0 - 34.0 pg   MCHC 30.6  30.0 - 36.0 g/dL   RDW 16.4 (*) 11.5 - 15.5 %   Platelets    150 - 400 K/uL   Value: PLATELET CLUMPS NOTED ON SMEAR, COUNT APPEARS DECREASED  RENAL FUNCTION PANEL      Result Value Ref Range   Sodium 135 (*) 137 - 147 mEq/L   Potassium 5.4 (*) 3.7 - 5.3 mEq/L   Chloride 106  96 - 112 mEq/L   CO2 15 (*) 19 - 32 mEq/L   Glucose, Bld 129 (*) 70 - 99 mg/dL   BUN 63 (*) 6 - 23 mg/dL   Creatinine, Ser 3.19 (*) 0.50 - 1.35 mg/dL   Calcium 8.6  8.4 - 10.5 mg/dL   Phosphorus 6.3 (*) 2.3 - 4.6 mg/dL   Albumin 3.3 (*) 3.5 - 5.2 g/dL   GFR calc non Af Amer 18 (*) >90 mL/min   GFR calc Af Amer 21 (*) >90 mL/min  PARATHYROID HORMONE, INTACT (NO CA)      Result Value Ref Range   PTH 60.9  14.0 - 72.0 pg/mL  PROTEIN ELECTROPHORESIS, SERUM      Result Value  Ref Range   Total Protein ELP 7.1  6.0 - 8.3 g/dL    Albumin ELP 50.9 (*) 55.8 - 66.1 %   Alpha-1-Globulin 5.8 (*) 2.9 - 4.9 %   Alpha-2-Globulin 8.3  7.1 - 11.8 %   Beta Globulin 3.3 (*) 4.7 - 7.2 %   Beta 2 4.5  3.2 - 6.5 %   Gamma Globulin 27.2 (*) 11.1 - 18.8 %   M-Spike, % 0.39     SPE Interp. (NOTE)     Comment (NOTE)    COMPREHENSIVE METABOLIC PANEL      Result Value Ref Range   Sodium 136 (*) 137 - 147 mEq/L   Potassium 5.4 (*) 3.7 - 5.3 mEq/L   Chloride 104  96 - 112 mEq/L   CO2 14 (*) 19 - 32 mEq/L   Glucose, Bld 119 (*) 70 - 99 mg/dL   BUN 61 (*) 6 - 23 mg/dL   Creatinine, Ser 3.23 (*) 0.50 - 1.35 mg/dL   Calcium 9.1  8.4 - 10.5 mg/dL   Total Protein 7.6  6.0 - 8.3 g/dL   Albumin 4.5  3.5 - 5.2 g/dL   AST 13  0 - 37 U/L   ALT 8  0 - 53 U/L   Alkaline Phosphatase 453 (*) 39 - 117 U/L   Total Bilirubin 0.9  0.3 - 1.2 mg/dL   GFR calc non Af Amer 18 (*) >90 mL/min   GFR calc Af Amer 21 (*) >90 mL/min  PROTIME-INR      Result Value Ref Range   Prothrombin Time 18.0 (*) 11.6 - 15.2 seconds   INR 1.53 (*) 0.00 - 1.49  CBC WITH DIFFERENTIAL      Result Value Ref Range   WBC 38.6 (*) 4.0 - 10.5 K/uL   RBC 3.37 (*) 4.22 - 5.81 MIL/uL   Hemoglobin 9.5 (*) 13.0 - 17.0 g/dL   HCT 31.2 (*) 39.0 - 52.0 %   MCV 92.6  78.0 - 100.0 fL   MCH 28.2  26.0 - 34.0 pg   MCHC 30.4  30.0 - 36.0 g/dL   RDW 16.4 (*) 11.5 - 15.5 %   Platelets 82 (*) 150 - 400 K/uL   Neutro Abs 29.3 (*) 1.7 - 7.7 K/uL   Lymphs Abs 1.2  0.7 - 4.0 K/uL   Monocytes Absolute 7.7 (*) 0.1 - 1.0 K/uL   Eosinophils Absolute 0.4  0.0 - 0.7 K/uL   Basophils Absolute 0.0  0.0 - 0.1 K/uL   Neutrophils Relative % 67  43 - 77 %   Lymphocytes Relative 3 (*) 12 - 46 %   Monocytes Relative 20 (*) 3 - 12 %   Eosinophils Relative 1  0 - 5 %   Basophils Relative 0  0 - 1 %   Band Neutrophils 0  0 - 10 %   Metamyelocytes Relative 3     Myelocytes 6     Promyelocytes Absolute 0     Blasts 0     nRBC 0  0 /100 WBC   RBC Morphology POLYCHROMASIA PRESENT     WBC  Morphology MILD LEFT SHIFT (1-5% METAS, OCC MYELO, OCC BANDS)    BODY FLUID CELL COUNT WITH DIFFERENTIAL      Result Value Ref Range   Fluid Type-FCT FLUID     Color, Fluid YELLOW (*) YELLOW   Appearance, Fluid HAZY (*) CLEAR   WBC, Fluid 548  0 - 1000 cu mm   Neutrophil  Count, Fluid 7  0 - 25 %   Lymphs, Fluid 18     Monocyte-Macrophage-Serous Fluid 75  50 - 90 %   Eos, Fluid 0    CBC      Result Value Ref Range   WBC 30.2 (*) 4.0 - 10.5 K/uL   RBC 3.13 (*) 4.22 - 5.81 MIL/uL   Hemoglobin 8.9 (*) 13.0 - 17.0 g/dL   HCT 28.9 (*) 39.0 - 52.0 %   MCV 92.3  78.0 - 100.0 fL   MCH 28.4  26.0 - 34.0 pg   MCHC 30.8  30.0 - 36.0 g/dL   RDW 16.5 (*) 11.5 - 15.5 %   Platelets 84 (*) 150 - 400 K/uL  BASIC METABOLIC PANEL      Result Value Ref Range   Sodium 137  137 - 147 mEq/L   Potassium 5.1  3.7 - 5.3 mEq/L   Chloride 106  96 - 112 mEq/L   CO2 15 (*) 19 - 32 mEq/L   Glucose, Bld 111 (*) 70 - 99 mg/dL   BUN 62 (*) 6 - 23 mg/dL   Creatinine, Ser 3.42 (*) 0.50 - 1.35 mg/dL   Calcium 9.1  8.4 - 10.5 mg/dL   GFR calc non Af Amer 17 (*) >90 mL/min   GFR calc Af Amer 20 (*) >90 mL/min  PROTIME-INR      Result Value Ref Range   Prothrombin Time 19.0 (*) 11.6 - 15.2 seconds   INR 1.64 (*) 0.00 - 1.49  CBC      Result Value Ref Range   WBC 31.1 (*) 4.0 - 10.5 K/uL   RBC 3.27 (*) 4.22 - 5.81 MIL/uL   Hemoglobin 9.2 (*) 13.0 - 17.0 g/dL   HCT 30.1 (*) 39.0 - 52.0 %   MCV 92.0  78.0 - 100.0 fL   MCH 28.1  26.0 - 34.0 pg   MCHC 30.6  30.0 - 36.0 g/dL   RDW 16.4 (*) 11.5 - 15.5 %   Platelets 90 (*) 150 - 400 K/uL  BASIC METABOLIC PANEL      Result Value Ref Range   Sodium 134 (*) 137 - 147 mEq/L   Potassium 4.7  3.7 - 5.3 mEq/L   Chloride 104  96 - 112 mEq/L   CO2 15 (*) 19 - 32 mEq/L   Glucose, Bld 112 (*) 70 - 99 mg/dL   BUN 59 (*) 6 - 23 mg/dL   Creatinine, Ser 3.50 (*) 0.50 - 1.35 mg/dL   Calcium 9.0  8.4 - 10.5 mg/dL   GFR calc non Af Amer 16 (*) >90 mL/min   GFR calc Af  Amer 19 (*) >90 mL/min  PROTIME-INR      Result Value Ref Range   Prothrombin Time 19.2 (*) 11.6 - 15.2 seconds   INR 1.67 (*) 0.00 - 1.49  CBC      Result Value Ref Range   WBC 28.7 (*) 4.0 - 10.5 K/uL   RBC 3.40 (*) 4.22 - 5.81 MIL/uL   Hemoglobin 9.6 (*) 13.0 - 17.0 g/dL   HCT 31.6 (*) 39.0 - 52.0 %   MCV 92.9  78.0 - 100.0 fL   MCH 28.2  26.0 - 34.0 pg   MCHC 30.4  30.0 - 36.0 g/dL   RDW 16.7 (*) 11.5 - 15.5 %   Platelets 103 (*) 150 - 400 K/uL  BASIC METABOLIC PANEL      Result Value Ref Range   Sodium 137  137 - 147 mEq/L   Potassium 4.5  3.7 - 5.3 mEq/L   Chloride 106  96 - 112 mEq/L   CO2 14 (*) 19 - 32 mEq/L   Glucose, Bld 101 (*) 70 - 99 mg/dL   BUN 57 (*) 6 - 23 mg/dL   Creatinine, Ser 3.53 (*) 0.50 - 1.35 mg/dL   Calcium 9.2  8.4 - 10.5 mg/dL   GFR calc non Af Amer 16 (*) >90 mL/min   GFR calc Af Amer 19 (*) >90 mL/min  PROTIME-INR      Result Value Ref Range   Prothrombin Time 19.7 (*) 11.6 - 15.2 seconds   INR 1.72 (*) 0.00 - 1.49  HEPATIC FUNCTION PANEL      Result Value Ref Range   Total Protein 7.3  6.0 - 8.3 g/dL   Albumin 4.0  3.5 - 5.2 g/dL   AST 16  0 - 37 U/L   ALT 9  0 - 53 U/L   Alkaline Phosphatase 403 (*) 39 - 117 U/L   Total Bilirubin 1.2  0.3 - 1.2 mg/dL   Bilirubin, Direct 0.7 (*) 0.0 - 0.3 mg/dL   Indirect Bilirubin 0.5  0.3 - 0.9 mg/dL  IRON AND TIBC      Result Value Ref Range   Iron 33 (*) 42 - 135 ug/dL   TIBC 128 (*) 215 - 435 ug/dL   Saturation Ratios 26  20 - 55 %   UIBC 95 (*) 125 - 400 ug/dL  FERRITIN      Result Value Ref Range   Ferritin 237  22 - 322 ng/mL  CREATININE, URINE, RANDOM      Result Value Ref Range   Creatinine, Urine 119.22    SODIUM, URINE, RANDOM      Result Value Ref Range   Sodium, Ur <20    CBC      Result Value Ref Range   WBC 34.3 (*) 4.0 - 10.5 K/uL   RBC 3.77 (*) 4.22 - 5.81 MIL/uL   Hemoglobin 10.5 (*) 13.0 - 17.0 g/dL   HCT 34.2 (*) 39.0 - 52.0 %   MCV 90.7  78.0 - 100.0 fL   MCH 27.9   26.0 - 34.0 pg   MCHC 30.7  30.0 - 36.0 g/dL   RDW 16.4 (*) 11.5 - 15.5 %   Platelets 114 (*) 150 - 400 K/uL  BASIC METABOLIC PANEL      Result Value Ref Range   Sodium 136 (*) 137 - 147 mEq/L   Potassium 4.3  3.7 - 5.3 mEq/L   Chloride 106  96 - 112 mEq/L   CO2 14 (*) 19 - 32 mEq/L   Glucose, Bld 99  70 - 99 mg/dL   BUN 59 (*) 6 - 23 mg/dL   Creatinine, Ser 3.36 (*) 0.50 - 1.35 mg/dL   Calcium 9.5  8.4 - 10.5 mg/dL   GFR calc non Af Amer 17 (*) >90 mL/min   GFR calc Af Amer 20 (*) >90 mL/min  PROTIME-INR      Result Value Ref Range   Prothrombin Time 20.9 (*) 11.6 - 15.2 seconds   INR 1.86 (*) 0.00 - 1.49  CBC      Result Value Ref Range   WBC 42.5 (*) 4.0 - 10.5 K/uL   RBC 3.69 (*) 4.22 - 5.81 MIL/uL   Hemoglobin 10.5 (*) 13.0 - 17.0 g/dL   HCT 33.4 (*) 39.0 - 52.0 %   MCV 90.5  78.0 - 100.0 fL   MCH 28.5  26.0 - 34.0 pg   MCHC 31.4  30.0 - 36.0 g/dL   RDW 16.6 (*) 11.5 - 15.5 %   Platelets 112 (*) 150 - 400 K/uL  BASIC METABOLIC PANEL      Result Value Ref Range   Sodium 138  137 - 147 mEq/L   Potassium 4.3  3.7 - 5.3 mEq/L   Chloride 107  96 - 112 mEq/L   CO2 16 (*) 19 - 32 mEq/L   Glucose, Bld 108 (*) 70 - 99 mg/dL   BUN 61 (*) 6 - 23 mg/dL   Creatinine, Ser 3.61 (*) 0.50 - 1.35 mg/dL   Calcium 9.6  8.4 - 10.5 mg/dL   GFR calc non Af Amer 16 (*) >90 mL/min   GFR calc Af Amer 18 (*) >90 mL/min  BODY FLUID CELL COUNT WITH DIFFERENTIAL      Result Value Ref Range   Fluid Type-FCT ASCITIC     Color, Fluid AMBER (*) YELLOW   Appearance, Fluid TURBID (*) CLEAR   WBC, Fluid 666  0 - 1000 cu mm   Neutrophil Count, Fluid 18  0 - 25 %   Lymphs, Fluid 25     Monocyte-Macrophage-Serous Fluid 57  50 - 90 %   Eos, Fluid NONE SEEN    SAVE SMEAR      Result Value Ref Range   Smear Review SMEAR STAINED AND AVAILABLE FOR REVIEW    CBC      Result Value Ref Range   WBC 39.5 (*) 4.0 - 10.5 K/uL   RBC 3.36 (*) 4.22 - 5.81 MIL/uL   Hemoglobin 9.5 (*) 13.0 - 17.0 g/dL   HCT  30.3 (*) 39.0 - 52.0 %   MCV 90.2  78.0 - 100.0 fL   MCH 28.3  26.0 - 34.0 pg   MCHC 31.4  30.0 - 36.0 g/dL   RDW 16.6 (*) 11.5 - 15.5 %   Platelets 107 (*) 150 - 400 K/uL  BASIC METABOLIC PANEL      Result Value Ref Range   Sodium 137  137 - 147 mEq/L   Potassium 4.4  3.7 - 5.3 mEq/L   Chloride 104  96 - 112 mEq/L   CO2 13 (*) 19 - 32 mEq/L   Glucose, Bld 99  70 - 99 mg/dL   BUN 64 (*) 6 - 23 mg/dL   Creatinine, Ser 3.88 (*) 0.50 - 1.35 mg/dL   Calcium 9.3  8.4 - 10.5 mg/dL   GFR calc non Af Amer 15 (*) >90 mL/min   GFR calc Af Amer 17 (*) >90 mL/min  PATHOLOGIST SMEAR REVIEW      Result Value Ref Range   Path Review Reactive mesothelial cells and blood.    A/P: Pt with hx of cryptogenic decompensated cirrhosis, refractory ascites/anasarca, portal hypertensive gastropathy,MELD score of 27 and acute on chronic kidney disease ? HRS. Tent plan is for paracentesis/TIPS on 5/15. Details/risks of procedure d/w pt/family by Dr. Vernard Gambles with their understanding and consent. Anesthesia service has been notified.

## 2013-08-07 NOTE — Progress Notes (Signed)
[facility] Progress Note    Since last GI note: Diagnostic paracentesis yesterday: poly count <250 so unlikely SBP.  Appreciate hematology input (leukamoid reaction) and ongoing nephrology input.    Long discussion yesterday with family and this AM with family and patient.  They understand the dire situation he is in right now.  ARF, refractory ascites, cirrhosis. He is miserable from the fluid overload and they would like to proceed with TIPS (they all clearly understand this may help but it also may make matters worse and they would not proceed with HD if his renal function worsens, would proceed with comfort care).  I brought up code status and recommended they consider DNR status, they will discuss with daughter who is Therapist, sports.  Objective: Vital signs in last 24 hours: Temp:  [97.4 F (36.3 C)-98.1 F (36.7 C)] 98.1 F (36.7 C) (05/14 0615) Pulse Rate:  [76-84] 81 (05/14 0615) Resp:  [9-20] 20 (05/14 0615) BP: (100-150)/(51-75) 130/68 mmHg (05/14 0615) SpO2:  [94 %-100 %] 100 % (05/14 0615) Weight:  [184 lb 4.9 oz (83.6 kg)] 184 lb 4.9 oz (83.6 kg) (05/14 0615) Last BM Date: 08/07/13 General: alert and oriented times 3, Heart: regular rate and rythm Abdomen: tense, distended with obvious large ascites, normal bowel sounds 2+ pitting edema B  Lab Results:  Recent Labs  08/05/13 0346 08/06/13 0515 08/07/13 0523  WBC 34.3* 42.5* 39.5*  HGB 10.5* 10.5* 9.5*  PLT 114* 112* 107*  MCV 90.7 90.5 90.2    Recent Labs  08/05/13 0346 08/06/13 0515 08/07/13 0523  NA 136* 138 137  K 4.3 4.3 4.4  CL 106 107 104  CO2 14* 16* 13*  GLUCOSE 99 108* 99  BUN 59* 61* 64*  CREATININE 3.36* 3.61* 3.88*  CALCIUM 9.5 9.6 9.3   Recent Labs  08/05/13 0346  INR 1.86*    Medications: Scheduled Meds: . famotidine  20 mg Oral QHS  . furosemide  160 mg Oral BID  . loperamide  2 mg Oral BID  . metronidazole  500 mg Intravenous Q8H  . midodrine  10 mg Oral TID WC  .  multivitamin with minerals  1 tablet Oral Daily  . octreotide  50 mcg Subcutaneous 3 times per day  . sodium bicarbonate  1,300 mg Oral BID  . vancomycin  250 mg Oral 4 times per day  . white petrolatum       Continuous Infusions:  PRN Meds:.acetaminophen, acetaminophen, HYDROcodone-acetaminophen, ondansetron (ZOFRAN) IV, ondansetron    Assessment/Plan: 68 y.o. 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  IF he improves, great.  If not, then probable comfort measures.    Milus Banister, MD  08/07/2013, 10:34 AM [facility] Pager [phone]

## 2013-08-08 ENCOUNTER — Other Ambulatory Visit (HOSPITAL_COMMUNITY): Payer: Medicare Other

## 2013-08-08 ENCOUNTER — Inpatient Hospital Stay (HOSPITAL_COMMUNITY): Payer: Medicare Other

## 2013-08-08 ENCOUNTER — Encounter (HOSPITAL_COMMUNITY)
Admission: AD | Disposition: A | Discharge status: Hospice | Payer: Self-pay | Source: Ambulatory Visit | Attending: Internal Medicine

## 2013-08-08 DIAGNOSIS — R143 Flatulence: Secondary | ICD-10-CM

## 2013-08-08 DIAGNOSIS — R142 Eructation: Secondary | ICD-10-CM

## 2013-08-08 DIAGNOSIS — R141 Gas pain: Secondary | ICD-10-CM

## 2013-08-08 LAB — AMMONIA: AMMONIA: 31 umol/L (ref 11–60)

## 2013-08-08 LAB — BASIC METABOLIC PANEL
BUN: 65 mg/dL — ABNORMAL HIGH (ref 6–23)
CHLORIDE: 102 meq/L (ref 96–112)
CO2: 17 mEq/L — ABNORMAL LOW (ref 19–32)
Calcium: 9.2 mg/dL (ref 8.4–10.5)
Creatinine, Ser: 4.16 mg/dL — ABNORMAL HIGH (ref 0.50–1.35)
GFR calc Af Amer: 15 mL/min — ABNORMAL LOW (ref >90)
GFR calc non Af Amer: 13 mL/min — ABNORMAL LOW (ref >90)
GLUCOSE: 106 mg/dL — AB (ref 70–99)
POTASSIUM: 3.7 meq/L (ref 3.7–5.3)
SODIUM: 138 meq/L (ref 137–147)

## 2013-08-08 LAB — APTT: APTT: 47 s — AB (ref 24–37)

## 2013-08-08 LAB — HEPATIC FUNCTION PANEL
ALT: 10 U/L (ref 0–53)
AST: 21 U/L (ref 0–37)
Albumin: 3.7 g/dL (ref 3.5–5.2)
Alkaline Phosphatase: 378 U/L — ABNORMAL HIGH (ref 39–117)
Bilirubin, Direct: 1.6 mg/dL — ABNORMAL HIGH (ref 0.0–0.3)
Indirect Bilirubin: 0.8 mg/dL (ref 0.3–0.9)
TOTAL PROTEIN: 6.9 g/dL (ref 6.0–8.3)
Total Bilirubin: 2.4 mg/dL — ABNORMAL HIGH (ref 0.3–1.2)

## 2013-08-08 LAB — COMPREHENSIVE METABOLIC PANEL
ALBUMIN: 3.5 g/dL (ref 3.5–5.2)
ALT: 10 U/L (ref 0–53)
AST: 21 U/L (ref 0–37)
Alkaline Phosphatase: 372 U/L — ABNORMAL HIGH (ref 39–117)
BILIRUBIN TOTAL: 2.6 mg/dL — AB (ref 0.3–1.2)
BUN: 67 mg/dL — AB (ref 6–23)
CHLORIDE: 101 meq/L (ref 96–112)
CO2: 17 mEq/L — ABNORMAL LOW (ref 19–32)
Calcium: 8.9 mg/dL (ref 8.4–10.5)
Creatinine, Ser: 4.18 mg/dL — ABNORMAL HIGH (ref 0.50–1.35)
GFR calc Af Amer: 15 mL/min — ABNORMAL LOW (ref >90)
GFR calc non Af Amer: 13 mL/min — ABNORMAL LOW (ref >90)
Glucose, Bld: 119 mg/dL — ABNORMAL HIGH (ref 70–99)
Potassium: 4.4 mEq/L (ref 3.7–5.3)
Sodium: 134 mEq/L — ABNORMAL LOW (ref 137–147)
TOTAL PROTEIN: 6.9 g/dL (ref 6.0–8.3)

## 2013-08-08 LAB — PROTIME-INR
INR: 2.24 — AB (ref 0.00–1.49)
INR: 2.45 — AB (ref 0.00–1.49)
PROTHROMBIN TIME: 25.8 s — AB (ref 11.6–15.2)
Prothrombin Time: 24.1 seconds — ABNORMAL HIGH (ref 11.6–15.2)

## 2013-08-08 LAB — CBC
HEMATOCRIT: 30.7 % — AB (ref 39.0–52.0)
Hemoglobin: 9.6 g/dL — ABNORMAL LOW (ref 13.0–17.0)
MCH: 28.4 pg (ref 26.0–34.0)
MCHC: 31.3 g/dL (ref 30.0–36.0)
MCV: 90.8 fL (ref 78.0–100.0)
Platelets: 91 10*3/uL — ABNORMAL LOW (ref 150–400)
RBC: 3.38 MIL/uL — AB (ref 4.22–5.81)
RDW: 16.7 % — ABNORMAL HIGH (ref 11.5–15.5)
WBC: 40.9 10*3/uL — AB (ref 4.0–10.5)

## 2013-08-08 SURGERY — RADIOLOGY WITH ANESTHESIA
Anesthesia: General

## 2013-08-08 MED ORDER — SODIUM CHLORIDE 0.9 % IV SOLN
INTRAVENOUS | Status: DC
  Administered 2013-08-08 (×2): via INTRAVENOUS

## 2013-08-08 NOTE — Progress Notes (Signed)
[facility] Progress Note    Since last GI note: Planning on TIPS today for refractory ascites.  I again brought up code status.  Family is waiting for RN daughter's opinion.  Hopefully sometime today.  Objective: Vital signs in last 24 hours: Temp:  [97.6 F (36.4 C)-98 F (36.7 C)] 98 F (36.7 C) (05/15 0707) Pulse Rate:  [72-73] 72 (05/15 0707) Resp:  [16] 16 (05/15 0707) BP: (116-137)/(65-78) 116/65 mmHg (05/15 0707) SpO2:  [94 %-97 %] 94 % (05/15 0707) Last BM Date: 08/07/13 General: alert and oriented times 3 Heart: regular rate and rythm Abdomen:tense, distended ascites normal bowel sounds 2+ pitting edema in legs  Lab Results:  Recent Labs  08/06/13 0515 08/07/13 0523 08/08/13 0355  WBC 42.5* 39.5* 40.9*  HGB 10.5* 9.5* 9.6*  PLT 112* 107* 91*  MCV 90.5 90.2 90.8    Recent Labs  08/06/13 0515 08/07/13 0523 08/08/13 0355  NA 138 137 138  K 4.3 4.4 3.7  CL 107 104 102  CO2 16* 13* 17*  GLUCOSE 108* 99 106*  BUN 61* 64* 65*  CREATININE 3.61* 3.88* 4.16*  CALCIUM 9.6 9.3 9.2    Recent Labs  08/08/13 0355  PROT 6.9  ALBUMIN 3.7  AST 21  ALT 10  ALKPHOS 378*  BILITOT 2.4*  BILIDIR 1.6*  IBILI 0.8    Recent Labs  08/08/13 0355  INR 2.24*     Medications: Scheduled Meds: . famotidine  20 mg Oral QHS  . furosemide  160 mg Oral BID  . loperamide  2 mg Oral BID  . metronidazole  500 mg Intravenous Q8H  . midodrine  10 mg Oral TID WC  . multivitamin with minerals  1 tablet Oral Daily  . octreotide  50 mcg Subcutaneous 3 times per day  . sodium bicarbonate  1,300 mg Oral BID  . vancomycin  250 mg Oral 4 times per day   Continuous Infusions:  PRN Meds:.acetaminophen, acetaminophen, HYDROcodone-acetaminophen, ondansetron (ZOFRAN) IV, ondansetron    Assessment/Plan: 68 y.o. male with cirrhosis, renal failure  For TIPS today, family and patient understand this may help, may not.  He is miserable with ascites that we can  otherwise not control without proven deterioration of renal function (LVP causes Cr to rise).  Awaiting daughter input on code status as well.   Milus Banister, MD  08/08/2013, 7:43 AM [facility] Pager [phone]

## 2013-08-08 NOTE — Procedures (Signed)
US guided therapeutic paracentesis performed yielding 9 liters bloody fluid. No immediate complications.

## 2013-08-08 NOTE — Progress Notes (Signed)
Pt. Foley removed and 700cc in bag d/t procedure being cancelled.  MD discontinued foley order and wrote to resume all previous orders.  Will continue to monitor.  Syliva Overman

## 2013-08-08 NOTE — Progress Notes (Signed)
MD stated to let pt. Choose how he wanted to handle his bladder being full.  Either catheter or in and out cath.  Pt. Stated that he would rather have a catheter put in so he wouldn't have to worry about it.  Will put catheter in. Syliva Overman

## 2013-08-08 NOTE — Progress Notes (Signed)
S:  No new CO TIPS cancelled as it was felt liver fx had worsened to the point that he would not benefit from it O:BP 138/77  Pulse 73  Temp(Src) 97.6 F (36.4 C) (Oral)  Resp 16  Ht _0  (1.702 m)  Wt 83.6 kg (184 lb 4.9 oz)  BMI 28.86 kg/m2  SpO2 97%  Intake/Output Summary (Last 24 hours) at 08/08/13 1354 Last data filed at 08/08/13 0904  Gross per 24 hour  Intake    240 ml  Output   1800 ml  Net  -1560 ml   Weight change:  LFY:BOFBP and alert CVS:RRR Resp:decreased BS bases Abd:+ BS + distention  + ascites Ext:3-4+ edema GU + scrotal edema NEURO:CNI Ox3 no asterixis   . famotidine  20 mg Oral QHS  . furosemide  160 mg Oral BID  . loperamide  2 mg Oral BID  . metronidazole  500 mg Intravenous Q8H  . midodrine  10 mg Oral TID WC  . multivitamin with minerals  1 tablet Oral Daily  . octreotide  50 mcg Subcutaneous 3 times per day  . sodium bicarbonate  1,300 mg Oral BID  . vancomycin  250 mg Oral 4 times per day   US Paracentesis  08/06/2013   CLINICAL DATA:  Recurrent abdominal ascites  EXAM: ULTRASOUND GUIDED right lower PARACENTESIS  COMPARISON:  Previous paracentesis  PROCEDURE: An ultrasound guided paracentesis was thoroughly discussed with the patient and questions answered. The benefits, risks, alternatives and complications were also discussed. The patient understands and wishes to proceed with the procedure. Written consent was obtained.  Ultrasound was performed to localize and mark an adequate pocket of fluid in the right lower quadrant of the abdomen. The area was then prepped and draped in the normal sterile fashion. 1% Lidocaine was used for local anesthesia. Under ultrasound guidance a 19 gauge Yueh catheter was introduced. Paracentesis was performed. The catheter was removed and a dressing applied.  Complications: None.  FINDINGS: A total of approximately 90 cc of blood and fluid was removed. A fluid sample was sent for laboratory analysis.  IMPRESSION:  Successful ultrasound guided paracentesis yielding 90 cc of ascites.  Diagnostic paracentesis only per MD. Asked to remove as little as possible for lab studies.  Read by: Jannifer [facility]   Electronically Signed   By: Jacqulynn Cadet M.D.   On: 08/06/2013 16:02   BMET    Component Value Date/Time   NA 134* 08/08/2013 1143   NA 138 07/16/2013 0839   K 4.4 08/08/2013 1143   CL 101 08/08/2013 1143   CO2 17* 08/08/2013 1143   GLUCOSE 119* 08/08/2013 1143   GLUCOSE 110* 07/16/2013 0839   BUN 67* 08/08/2013 1143   BUN 33* 07/16/2013 0839   CREATININE 4.18* 08/08/2013 1143   CREATININE 2.04* 10/11/2012 0853   CALCIUM 8.9 08/08/2013 1143   GFRNONAA 13* 08/08/2013 1143   GFRNONAA 33* 10/11/2012 0853   GFRAA 15* 08/08/2013 1143   GFRAA 38* 10/11/2012 0853   CBC    Component Value Date/Time   WBC 40.9* 08/08/2013 0355   WBC 24.8* 07/16/2013 0839   WBC 4.2* 10/11/2012 0939   RBC 3.38* 08/08/2013 0355   RBC 3.54* 07/16/2013 0839   RBC 4.8 10/11/2012 0939   HGB 9.6* 08/08/2013 0355   HGB 14.6 10/11/2012 0939   HCT 30.7* 08/08/2013 0355   HCT 41.9* 10/11/2012 0939   PLT 91* 08/08/2013 0355   MCV 90.8 08/08/2013 0355   MCV  87.5 10/11/2012 0939   MCH 28.4 08/08/2013 0355   MCH 28.0 07/16/2013 0839   MCH 30.6 10/11/2012 0939   MCHC 31.3 08/08/2013 0355   MCHC 31.9 07/16/2013 0839   MCHC 34.9 10/11/2012 0939   RDW 16.7* 08/08/2013 0355   RDW 15.9* 07/16/2013 0839   LYMPHSABS 1.2 08/01/2013 0750   LYMPHSABS 3.5* 07/16/2013 0839   MONOABS 7.7* 08/01/2013 0750   EOSABS 0.4 08/01/2013 0750   EOSABS 0.0 07/16/2013 0839   BASOSABS 0.0 08/01/2013 0750   BASOSABS 0.0 07/16/2013 0839     Assessment: 1. Acute on CKD 3.  UO less yest 2. C diff colitis   3. Cirrhosis 4. Presumably met a acidosis 5. Leukocytosis  Plan: 1.I discussed with Pt and family that there is little left to do and that he should spend his remaining time at home.  Family and pt agreeable to this.  Will ask palliative care to see.  Will DC any further lab  draws and liberalize his diet.  2. Will sign off Windy Kalata

## 2013-08-08 NOTE — Progress Notes (Signed)
Patient GY:KZLDJ H Piltz      DOB: Apr 20, 1945      TTS:[id number]  Spoke with Patient's daughter [name].  She and her father were expecting to hear from me .  We can meet with them at 10 am Saturday 5/16.     Emilene Roma L. Lovena Le, MD MBA The Palliative Medicine Team at [facility] Phone: [phone] Pager: [phone]

## 2013-08-08 NOTE — Progress Notes (Signed)
Pt. C/o feeling like he has to urinate but cannot.  Bladder scanned pt. And revealed >677cc.  MD on call paged. Syliva Overman

## 2013-08-08 NOTE — Progress Notes (Signed)
TIPS cancelled.  Patient and family understand that with LFTs, INR trending upward the risk/benefit calculation changes regarding the TIPS placement.  Palliative care consult placed.  We will order large volume paracentesis, understanding it probably will worsen his renal function but will not be checking labs.  Will hope that palliative care can also help with hospice referral.  All have also agreed to change code status to DNR.

## 2013-08-08 NOTE — Progress Notes (Signed)
Michael Bowers   DOB:April 02, 1945   HW#:[id number]   VEL#:[id number] I have seen the patient, examined him and edited the notes as follows  Subjective: He is not comfortable this morning due to abdominal distention. Had paracentesis on 5/13 yielding only 90 cc of blood tinged fluid. Fluid path review with reactive mesothelial cells. Transjugular Intrahepatic Portosystemic Shunt (TIPS) is being planned today by GI service due to refractory ascites if family is agreeable. He denies further diarrhea. The patient denies any recent signs or symptoms of bleeding such as spontaneous epistaxis, hematuria or hematochezia.   Objective:  Filed Vitals:   08/08/13 0707  BP: 116/65  Pulse: 72  Temp: 98 F (36.7 C)  Resp: 16    Body mass index is 28.86 kg/(m^2).  Intake/Output Summary (Last 24 hours) at 08/08/13 0849 Last data filed at 08/08/13 0700  Gross per 24 hour  Intake    480 ml  Output   1820 ml  Net  -1340 ml      GENERAL: 68 year old African American male alert, no distress but appears uncomfortable due to abdominal distention. He is ill appearing.  SKIN: skin color, texture, turgor are normal, no rashes or significant lesions  EYES: normal, conjunctiva are pale and non-injected, sclera clear  OROPHARYNX: no exudate, no erythema and lips, buccal mucosa, and tongue normal  NECK: supple, thyroid normal size, non-tender, without nodularity  LYMPH: no palpable lymphadenopathy in the cervical, axillary or inguinal area  LUNGS: decreased breath sounds at the bases HEART: regular rate & rhythm and no murmurs and 3+ bilateral lower extremity edema  ABDOMEN: abdomen distended and tense due to ascites, somewhat tender and active bowel sounds  Musculoskeletal: no cyanosis of digits and no clubbing  PSYCH: alert & oriented x 3 with fluent speech  NEURO: no focal motor/sensory deficits  Labs:   Recent Labs Lab 08/04/13 0615 08/05/13 0346 08/06/13 0515 08/07/13 0523 08/08/13 0355  WBC  28.7* 34.3* 42.5* 39.5* 40.9*  HGB 9.6* 10.5* 10.5* 9.5* 9.6*  HCT 31.6* 34.2* 33.4* 30.3* 30.7*  PLT 103* 114* 112* 107* 91*  MCV 92.9 90.7 90.5 90.2 90.8  MCH 28.2 27.9 28.5 28.3 28.4  MCHC 30.4 30.7 31.4 31.4 31.3  RDW 16.7* 16.4* 16.6* 16.6* 16.7*     Chemistries:    Recent Labs Lab 08/04/13 0615 08/05/13 0346 08/06/13 0515 08/07/13 0523 08/08/13 0355  NA 137 136* 138 137 138  K 4.5 4.3 4.3 4.4 3.7  CL 106 106 107 104 102  CO2 14* 14* 16* 13* 17*  GLUCOSE 101* 99 108* 99 106*  BUN 57* 59* 61* 64* 65*  CREATININE 3.53* 3.36* 3.61* 3.88* 4.16*  CALCIUM 9.2 9.5 9.6 9.3 9.2  AST 16  --   --   --  21  ALT 9  --   --   --  10  ALKPHOS 403*  --   --   --  378*  BILITOT 1.2  --   --   --  2.4*    GFR Estimated Creatinine Clearance: 17.3 ml/min (by C-G formula based on Cr of 4.16).  Liver Function Tests:  Recent Labs Lab 08/04/13 0615 08/08/13 0355  AST 16 21  ALT 9 10  ALKPHOS 403* 378*  BILITOT 1.2 2.4*  PROT 7.3 6.9  ALBUMIN 4.0 3.7    Studies:  US Paracentesis  08/06/2013   CLINICAL DATA:  Recurrent abdominal ascites  EXAM: ULTRASOUND GUIDED right lower PARACENTESIS  COMPARISON:  Previous paracentesis  PROCEDURE: An ultrasound guided paracentesis was thoroughly discussed with the patient and questions answered. The benefits, risks, alternatives and complications were also discussed. The patient understands and wishes to proceed with the procedure. Written consent was obtained.  Ultrasound was performed to localize and mark an adequate pocket of fluid in the right lower quadrant of the abdomen. The area was then prepped and draped in the normal sterile fashion. 1% Lidocaine was used for local anesthesia. Under ultrasound guidance a 19 gauge Yueh catheter was introduced. Paracentesis was performed. The catheter was removed and a dressing applied.  Complications: None.  FINDINGS: A total of approximately 90 cc of blood and fluid was removed. A fluid sample was sent  for laboratory analysis.  IMPRESSION: Successful ultrasound guided paracentesis yielding 90 cc of ascites.  Diagnostic paracentesis only per MD. Asked to remove as little as possible for lab studies.  Read by: Jannifer Franklin [facility]   Electronically Signed   By: Jacqulynn Cadet M.D.   On: 08/06/2013 16:02    Assessment/Plan:    1. Leukocytosis  This is compatible with leukemoid reaction on someone who was recently treated for C. Diff infection. There may be a slight delay between the clinical response to antibiotics to the time of WBC reduction. At this time his leukocytosis is essentially unchanged. Will continue to observe  2. 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  No significant change over the last 48 hours. Continue to monitor.  3. Anemia  Anemia likely of acute on chronic disease and renal failure. No bleeding issues reported. Will continue to monitor closely. No transfusion indicated at this time.   4. Non-specific lymphadenopathy on recent CT scan  Difficult to interpret without IV contrast. Recommend follow-up imaging in the future   5. Recurrent abdominal ascites: Patient is likely to undergo Transjugular Intrahepatic Portosystemic Shunt (TIPS) today with paracentesis as per interventional radiology. GI following   Other medical issues as per admitting team and specialties   **Disclaimer: This note was dictated with voice recognition software. Similar sounding words can inadvertently be transcribed and this note may contain transcription errors which may not have been corrected upon publication of note.Rondel Jumbo, PA-C 08/08/2013  8:49 AM Heath Lark, MD 08/08/2013

## 2013-08-08 NOTE — Progress Notes (Signed)
Foley inserted and immediate return of 800cc. Michael Bowers

## 2013-08-08 NOTE — Progress Notes (Signed)
Patient ID: Michael Bowers, male   DOB: 11/29/1945, 68 y.o.   MRN: [id number] The patient is somenolent in bed with obvious ascites but comfortable. His bloodwork has unfortunately worsened. INR 2.6, Bili 2.6, Cr 4. TIPS is too risky at this time. We can offer a paracentesis, or Aspira drain if patient is under the care of Hospice.

## 2013-08-09 DIAGNOSIS — Z515 Encounter for palliative care: Secondary | ICD-10-CM

## 2013-08-09 LAB — BODY FLUID CULTURE: CULTURE: NO GROWTH

## 2013-08-09 MED ORDER — LOPERAMIDE HCL 2 MG PO CAPS: 2.0000 mg | ORAL_CAPSULE | Freq: Two times a day (BID) | ORAL | Status: AC

## 2013-08-09 MED ORDER — HYDROCODONE-ACETAMINOPHEN 5-325 MG PO TABS: 1.0000 | ORAL_TABLET | ORAL | Status: AC | PRN

## 2013-08-09 MED ORDER — VANCOMYCIN 50 MG/ML ORAL SOLUTION: 250.0000 mg | Freq: Four times a day (QID) | ORAL | Status: AC

## 2013-08-09 MED ORDER — ONDANSETRON HCL 4 MG PO TABS: 4.0000 mg | ORAL_TABLET | Freq: Four times a day (QID) | ORAL | Status: AC | PRN

## 2013-08-09 NOTE — Consult Note (Signed)
Patient Michael Bowers      DOB: 1945-08-30      POE:[id number]     Consult Note from the Palliative Medicine Team at [facility] Requested by:  Dr.  Ardis Hughs    PCP: Redge Gainer, MD Reason for Consultation: [place]     Phone Number:[phone]  Assessment of patients Current state: Patient is a sweet 68 yr old african [place] male with a known history or NASH who was admitted with C diff colitis, worsening failure to thrive and lower extremity edema.  He is requiring more frequent paracentesis.  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   Patient has known advanced disease prior to admission.  I met with his very supportive family.  They informed me that they understand where they are in their dad's life process.  The patient relates he can't change what the facts are.  They would like to go home as soon as possible with Hospice care.  They have had experience before with hospice.  We reviewed a [place] form to capture Michael Bowers wishes.  No CPr, No feeding tubes, abx if it will promote comfort. i will make referal for transition to hospice at home once medically stable.   Goals of Care: 1.  Code Status: DNR , golden rod placed on chart   2. Scope of Treatment: Continue treatment of Cdiff for comfort.  May need further paracentesis for comfort  4. Disposition: Home with Hospice    3. Symptom Management:   1. Treat Cdiff 2. May need Roxanol later in course.  Hospice can add.  4. 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 He loves to fish and play horseshoes.  5. 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  Patient requiring more frequent paracentesis, has  known varices and gastropathy and is likely in hepatorenal syndrome.  We were asked to help with goals of care considering hospice.   ROS:  Loose stools persist but not as frequent.  No pain, poor appetite.  No nausea or vomiting.    PMH:  Past Medical History  Diagnosis Date  . HTN (hypertension)   . CAD (coronary artery disease)     [name];  [name], no ischemia, [name]  . CKD (chronic kidney disease)   . ED (erectile dysfunction)   . Colon polyps   . Cirrhosis     cryptogennic  . Hepatomegaly   . Ascites     Michael Bowers 07/30/2013  . GERD (gastroesophageal reflux disease)      PSH: Past Surgical History  Procedure Laterality Date  . Colonoscopy  10/2007    Dr. Amedeo Plenty, inflammatory polyp  . Paracentesis      "several"/notes 07/30/2013  . Coronary angioplasty with stent placement  02/2000    1 stent/notes 02/28/2000  . Flexible sigmoidoscopy N/A 08/06/2013    Procedure: FLEXIBLE SIGMOIDOSCOPY;  Surgeon: Milus Banister, MD;  Location: [place];  Service: Endoscopy;  Laterality: N/A;   I have reviewed the [place] and SH and  If appropriate update it with new information. No Known Allergies Scheduled Meds: . famotidine  20 mg Oral QHS  . furosemide  160 mg Oral BID  . loperamide  2 mg Oral BID  . metronidazole  500 mg Intravenous Q8H  . midodrine  10 mg Oral TID WC  . multivitamin with minerals  1 tablet Oral Daily  . octreotide  50 mcg Subcutaneous 3 times per day  . sodium bicarbonate  1,300 mg Oral BID  . vancomycin  250 mg Oral 4 times per day   Continuous Infusions: . sodium chloride 50 mL/hr at 08/08/13 1742   PRN Meds:.acetaminophen, acetaminophen, HYDROcodone-acetaminophen, ondansetron (ZOFRAN) IV, ondansetron    BP 101/54  Pulse 66  Temp(Src) 98.3 F (36.8 C) (Oral)  Resp 14  Ht $R'5\' 7"'Xj$  (1.702 m)  Wt 77.1 kg (169 lb 15.6 oz)  BMI 26.62 kg/m2  SpO2 99%   PPS:  30-40 %   Intake/Output Summary (Last 24 hours) at 08/09/13 1152 Last data filed at 08/09/13 0938  Gross per 24 hour  Intake 1338.34 ml  Output   3750 ml  Net -2411.66 ml    Physical Exam:  General: no acute distress, very pleasant, with full capacity to make decisions HEENT:  PERRL, EOMI, anicteric, mmm Chest:   Decreased but clear CVS: regular S1, s2 Abdomen:distended, not tender, positive bowel sounds, positive ascites Ext: thin and frail Neuro:awake, alert oriented CN II-xII intact  Labs: CBC    Component Value Date/Time   WBC 40.9* 08/08/2013 0355   WBC 24.8* 07/16/2013 0839   WBC 4.2* 10/11/2012 0939   RBC 3.38* 08/08/2013 0355   RBC 3.54* 07/16/2013 0839   RBC 4.8 10/11/2012 0939   HGB 9.6* 08/08/2013 0355   HGB 14.6 10/11/2012 0939   HCT 30.7* 08/08/2013 0355   HCT 41.9* 10/11/2012 0939   PLT 91* 08/08/2013 0355   MCV 90.8 08/08/2013 0355   MCV 87.5 10/11/2012 0939   MCH 28.4 08/08/2013 0355   MCH 28.0 07/16/2013 0839   MCH 30.6 10/11/2012 0939   MCHC 31.3 08/08/2013 0355   MCHC 31.9 07/16/2013 0839   MCHC 34.9 10/11/2012 0939   RDW 16.7* 08/08/2013 0355   RDW 15.9* 07/16/2013 0839   LYMPHSABS 1.2 08/01/2013 0750   LYMPHSABS 3.5* 07/16/2013 0839   MONOABS 7.7* 08/01/2013 0750   EOSABS 0.4 08/01/2013 0750   EOSABS 0.0 07/16/2013 0839   BASOSABS 0.0 08/01/2013 0750   BASOSABS 0.0 07/16/2013 0839     CMP     Component Value Date/Time   NA 134* 08/08/2013 1143   NA 138 07/16/2013 0839   K 4.4 08/08/2013 1143   CL 101 08/08/2013 1143   CO2 17* 08/08/2013 1143   GLUCOSE 119* 08/08/2013 1143   GLUCOSE 110* 07/16/2013 0839   BUN 67* 08/08/2013 1143   BUN 33* 07/16/2013 0839   CREATININE 4.18* 08/08/2013 1143   CREATININE 2.04* 10/11/2012 0853   CALCIUM 8.9 08/08/2013 1143   PROT 6.9 08/08/2013 1143   PROT 8.4 05/23/2013 1649   ALBUMIN 3.5 08/08/2013 1143   AST 21 08/08/2013 1143   ALT 10 08/08/2013 1143   ALKPHOS 372* 08/08/2013 1143   BILITOT 2.6* 08/08/2013 1143   GFRNONAA 13* 08/08/2013  1143   GFRNONAA 33* 10/11/2012 0853   GFRAA 15* 08/08/2013 1143   GFRAA 38* 10/11/2012 0853      Time In Time Out Total Time Spent with Patient Total Overall Time  1030 11 30 min 30 min  Greater than 50%  of this time was spent counseling and coordinating care related to the above assessment and plan.   Kyriakos Babler L. Lovena Le, MD MBA The Palliative Medicine Team at [facility] Phone: [phone] Pager: [phone]

## 2013-08-09 NOTE — Progress Notes (Signed)
Discharge instructions and prescription given and explained to pt and pt.'s family members.  Pt. Going home with foley and has been switched to leg bag.  Family has been educated on leg bag and night time catheter bag.  Family and pt. Deny any questions.  IV removed.  MD aware that pt. Is being discharged with the possibility of hospice care not being available until Monday.  Pt. In stable condition for discharge.  All belongings sent home with pt.  Pt. Discharged to home with family. Syliva Overman

## 2013-08-09 NOTE — Progress Notes (Signed)
[facility] Progress Note    Since last GI note: LVP yesterday 9 liters removed, foley replaced due to urinary retention.    Greatly appreciate Dr. Lovena Le input from palliative care.  Goal is home hospice and SW is already working on that.  Objective: Vital signs in last 24 hours: Temp:  [97.6 F (36.4 C)-98.3 F (36.8 C)] 98.3 F (36.8 C) (05/16 0631) Pulse Rate:  [66-73] 66 (05/15 2231) Resp:  [14-16] 14 (05/16 0631) BP: (101-138)/(54-77) 101/54 mmHg (05/16 0631) SpO2:  [97 %-99 %] 99 % (05/16 0631) Weight:  [169 lb 15.6 oz (77.1 kg)] 169 lb 15.6 oz (77.1 kg) (05/16 0631) Last BM Date: 08/09/13 General: alert and oriented times 3 Heart: regular rate and rythm Abdomen: softer, non-tender, non-distended, normal bowel sounds   Lab Results:  Recent Labs  08/07/13 0523 08/08/13 0355  WBC 39.5* 40.9*  HGB 9.5* 9.6*  PLT 107* 91*  MCV 90.2 90.8    Recent Labs  08/07/13 0523 08/08/13 0355 08/08/13 1143  NA 137 138 134*  K 4.4 3.7 4.4  CL 104 102 101  CO2 13* 17* 17*  GLUCOSE 99 106* 119*  BUN 64* 65* 67*  CREATININE 3.88* 4.16* 4.18*  CALCIUM 9.3 9.2 8.9    Recent Labs  08/08/13 0355 08/08/13 1143  PROT 6.9 6.9  ALBUMIN 3.7 3.5  AST 21 21  ALT 10 10  ALKPHOS 378* 372*  BILITOT 2.4* 2.6*  BILIDIR 1.6*  --   IBILI 0.8  --     Recent Labs  08/08/13 0355 08/08/13 1143  INR 2.24* 2.45*    Medications: Scheduled Meds: . famotidine  20 mg Oral QHS  . furosemide  160 mg Oral BID  . loperamide  2 mg Oral BID  . metronidazole  500 mg Intravenous Q8H  . midodrine  10 mg Oral TID WC  . multivitamin with minerals  1 tablet Oral Daily  . octreotide  50 mcg Subcutaneous 3 times per day  . sodium bicarbonate  1,300 mg Oral BID  . vancomycin  250 mg Oral 4 times per day   Continuous Infusions: . sodium chloride 50 mL/hr at 08/08/13 1742   PRN Meds:.acetaminophen, acetaminophen, HYDROcodone-acetaminophen, ondansetron (ZOFRAN) IV,  ondansetron    Assessment/Plan: 68 y.o. male with cirrhosis, renal failure, refractory ascites  Will plan on d/c to home hospice when everything is set up to receive him.  He may need periodic large volume paracentesis for comfort, will depend on his clinical course.  Should complete 7 more days of antibiotics for c. Diff.    Milus Banister, MD  08/09/2013, 11:18 AM [facility] Pager [phone]

## 2013-08-09 NOTE — Progress Notes (Signed)
Informed Dr. Ardis Hughs that hospice will not be completely set up for pt. Until Sunday or Monday.  Meaning the pt.'s equipment will not come until Sunday or Monday.  MD was still ok for pt. To go home as long as pt. Understood that he would not receive any hospice care until Sunday or Monday.  Informed family and family wishes to discharge.  Waiting on daughter to return and explain this to her and then will discharge pt. Michael Bowers

## 2013-08-09 NOTE — Consult Note (Signed)
Patient LD:JTTSV H Rhue      DOB: 02/11/1946      XBL:[id number]  Summary for goals of care; full note to follow:  Met with patient , three daughters, one son and son in law and significant other .  Patient and family aware of prognosis and diagnosis.  They have experience and knowledge with hospice and want to take [place] home to spend his time with family.  They live in [place].  Patient states "I have no choice" .  He is resigned to his diagnosis and seems to understand that disease process.  When asked if he would want feeding  Tubes because of his poor appetite he told his family no.  We have completed a MOST form and DNr form for him.  I have contacted Care manager about arranging home with hospice when medically stable.   Rec:  1.  DNR ; [facility] and most on chart  2. CDiff: continue with oral therapy for comfort  3.  Cryptogenic cirrhosis with hepatorenal syndrome.  If octreotide needed for comfort consider depot formulation.   4.  Prognosis: like weeks to months.  May need paracentesis for comfort in future.  Total time 1030-11   Disucssed with Dr. Ardis Hughs.  Norah Devin L. Lovena Le, MD MBA The Palliative Medicine Team at [facility] Phone: [phone] Pager: [phone]

## 2013-08-09 NOTE — Care Management Note (Signed)
    Page 1 of 2   08/09/2013     1:03:15 PM CARE MANAGEMENT NOTE 08/09/2013  Patient:  Michael Bowers, Michael Bowers   Account Number:  [id number]  Date Initiated:  08/04/2013  Documentation initiated by:  Magdalen Spatz  Subjective/Objective Assessment:   adm: evaluation of hepatomegaly; abdominal discomfort and distention     Action/Plan:   discharge planning   Anticipated DC Date:  08/09/2013   Anticipated DC Plan:  [place]  CM consult      PAC Choice  HOSPICE   Choice offered to / List presented to:  C-4 Adult Children      DME agency  [place] agency  [facility]   Status of service:  Completed, signed off Medicare Important Message given?   (If response is "NO", the following Medicare IM given date fields will be blank) Date Medicare IM given:   Date Additional Medicare IM given:  08/04/2013  Discharge Disposition:  [place]  Per UR Regulation:    If discussed at Long Length of Stay Meetings, dates discussed:   08/05/2013  08/07/2013    Comments:  08/08/13 12:00 CM received call from MD requesting [facility] for home hospice and pt ready for discharge today.  CM spoke with family to confirm Hospice choice and Michael Bowers, daughter of pt will be contact: [phone].  Referral called to [facility]. CM spoke with Amy, RN who requests I faxFacesheet with contact information Deri Fuelling)  Palliative Consult, Gi discharge summary , H&P, Orders, and Summary of Goals to [phone].  All aforementioned faxed.  Amy to call Deri Fuelling to arrange hospice but is unsure as to whether this can be accomplished today. Amy states she will call [name] today.   DME will be arranged through [place], [place].  Family states pt can go home with them in the car (no ambulance transport is requested).  No other CM needs were communicated.  Mariane Masters, BSN, CM  [phone].

## 2013-08-09 NOTE — Discharge Summary (Signed)
[facility] Discharge Summary  Name: Michael Bowers MRN: [id number] DOB: [birth year] 68 y.o. PCP:  Redge Gainer, MD  Date of Admission: 07/30/2013  4:11 PM Date of Discharge: 08/09/2013 Primary Gastroenterologist: Erskine Emery, MD Discharging Physician: Owens Loffler, MD  Discharge Diagnosis: 1. Cryptogenic cirrhosis with HRS 2. Marked leukocytosis 2. C-diff.  Consultations: Nephrology Infectious Disease Hematology Palliative Care  Procedures Performed:  Dg Chest 2 View  07/30/2013   CLINICAL DATA:  Lower extremity swelling, leukocytosis  EXAM: CHEST  2 VIEW  COMPARISON:  No comparisons  FINDINGS: The heart size and mediastinal contours are within normal limits. Both lungs are clear. The visualized skeletal structures are unremarkable.  IMPRESSION: No active cardiopulmonary disease.   Electronically Signed   By: Kathreen Devoid   On: 07/30/2013 19:59   US Paracentesis on:  08/09/2013    08/06/2013  08/01/2013 07/24/2013  07/15/2013  GI Procedures: Sigmoidoscopy 08/06/13 for evaluation of recent c-diff and persistent leukocytosis. Findings: Normal mucosa  History/Physical Exam: See Admission H&P  Admission HPI: Per Amy Esterwood, P.A. Michael Bowers is a 68 y.o. male known recently to Dr. Deatra Ina, he was [facility] in mid March of 2015 for evaluation of hepatomegaly. Patient has history of coronary artery disease he status post CABG and has history of chronic renal insufficiency with baseline creatinine around 1.8 as well as hypertension. Patient's primary complaint was abdominal discomfort and distention. 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 He had significant ascites and volume overload and has undergone several paracenteses over the past month and a half for refractory ascites.  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 He was then also scheduled for colonoscopy this was done on 07/08/2013. He had 3 small polyps which were removed. Patient developed diarrhea after the colonoscopy and has since been diagnosed with C. difficile colitis. He was initially started on a course of metronidazole and after 4-5 days of treatment had no change in his symptoms and was switched to vancomycin last week. He has been on 2 and 50 mg 4 times daily over the past week. He continues with diarrhea having 4-5 loose to liquid stools per day which been nonbloody. He has had no complaints of abdominal pain or cramping no fever or chills no nausea or vomiting and has been eating without difficulty.  Patient had been started on low-dose diuretics initially with Lasix 20 mg daily and then added Aldactone 50 mg daily. He had a gradual rise in his creatinine and both diuretics were stopped 1 week ago. His creatinine was 2.6 at that time. Due to marked ascites which makes him quite uncomfortable and short of breath he did undergo a paracentesis last week on 07/23/2013 with removal of about 6 L of fluid.-No cell counts were sent with that specimen.  Patient was seen earlier this week in the office with Dr. Deatra Ina WBC was 21,000 which was down slightly and creatinine was 3.5. Hospitalization was discussed but decision made to reevaluate in 48 hours. He comes back today stat labs were done and WBC is now up to 22.8, hemoglobin 10.1 hematocrit of 30.7 platelets 89,000 sodium 132 CO2 16 BUN 62 and creatinine of 3.6 urine electrolytes had been drawn on the fourth and resulted on the fifth with urine sodium of less than 15.  Patient feels about the same no  specific complaints today weight is stable and diarrhea continues with about 4 bowel movements per day no complaints of abdominal pain cramping nausea fever etc. Patient was hoping to have a horseshoe tournament at his house this weekend.  He is  admitted today with probable impending hepatorenal syndrome, decompensated cirrhosis and C. difficile colitis   Hospital Course by problem list:  1. ESLD with HRS. At time of admission patient was already requiring frequent large volume paracentesis. He had not tolerated diuretics secondary to worsening renal failure. Upon admission, Nephrology evaluated patient. They tried IV albumin, midodrine and Sandostatin. Renal function improved transiently but then deteriorated further. Patient required several additional therapeutic LVPs this admission. Interventional Radiology didn't feel patient was appropriate candidate for TIPS. Patient had CKD but Nephrology felt he had developed HRS with limited treatment options.. Palliative Care was consulted. Patient was discharged home with [facility].   2. Marked leukocytosis, felt initially to be secondary to C-diff diagnosed prior to admission. WBC remained quite elevated despite improvement in symptoms on dual c-diff therapy. ID evaluated. No evidence for SBP. Sigmoidoscopy negative for pseudomembranes. Hematology evaluated leukocytosis and felt this was c/w a leukemoid reaction.  3. C-diff. This was diagnosed prior to admission. Patient treated with flagyl and Vancomycin. Diarrhea improved several days prior to discharge. Patient would complete 4 additional days of Vancomycin at home    Discharge Vitals:  BP 101/54  Pulse 66  Temp(Src) 98.3 F (36.8 C) (Oral)  Resp 14  Ht 5\' 7"  (1.702 m)  Wt 169 lb 15.6 oz (77.1 kg)  BMI 26.62 kg/m2  SpO2 99%   Discharge Labs:  Results for orders placed during the hospital encounter of 07/30/13 (from the past 24 hour(s))  AMMONIA     Status: None   Collection Time    08/08/13 11:43 AM      Result Value Ref Range   Ammonia 31  11 - 60 umol/L  PROTIME-INR     Status: Abnormal   Collection Time    08/08/13 11:43 AM      Result Value Ref Range   Prothrombin Time 25.8 (*) 11.6 - 15.2 seconds   INR 2.45 (*)  0.00 - 1.49  COMPREHENSIVE METABOLIC PANEL     Status: Abnormal   Collection Time    08/08/13 11:43 AM      Result Value Ref Range   Sodium 134 (*) 137 - 147 mEq/L   Potassium 4.4  3.7 - 5.3 mEq/L   Chloride 101  96 - 112 mEq/L   CO2 17 (*) 19 - 32 mEq/L   Glucose, Bld 119 (*) 70 - 99 mg/dL   BUN 67 (*) 6 - 23 mg/dL   Creatinine, Ser 4.18 (*) 0.50 - 1.35 mg/dL   Calcium 8.9  8.4 - 10.5 mg/dL   Total Protein 6.9  6.0 - 8.3 g/dL   Albumin 3.5  3.5 - 5.2 g/dL   AST 21  0 - 37 U/L   ALT 10  0 - 53 U/L   Alkaline Phosphatase 372 (*) 39 - 117 U/L   Total Bilirubin 2.6 (*) 0.3 - 1.2 mg/dL   GFR calc non Af Amer 13 (*) >90 mL/min   GFR calc Af Amer 15 (*) >90 mL/min    Disposition and follow-up:   Mr.Johnrobert H Whitefield was [facility] in [facility] condition.     Discharge Medications:   Medication List    STOP taking these medications  amLODipine 10 MG tablet  Commonly known as:  NORVASC     esomeprazole 40 MG capsule  Commonly known as:  NEXIUM     FIRST-VANCOMYCIN 50 50 MG/ML Soln  Replaced by:  vancomycin 50 mg/mL oral solution     hydrochlorothiazide 25 MG tablet  Commonly known as:  HYDRODIURIL     metoprolol succinate 100 MG 24 hr tablet  Commonly known as:  TOPROL-XL     spironolactone 50 MG tablet  Commonly known as:  ALDACTONE      TAKE these medications       aspirin 81 MG tablet  Take 81 mg by mouth daily.     HYDROcodone-acetaminophen 5-325 MG per tablet  Commonly known as:  NORCO/VICODIN  Take 1-2 tablets by mouth every 4 (four) hours as needed for moderate pain.     lansoprazole 30 MG capsule  Commonly known as:  PREVACID  Take 1 capsule (30 mg total) by mouth 2 (two) times daily before a meal.     loperamide 2 MG capsule  Commonly known as:  IMODIUM  Take 1 capsule (2 mg total) by mouth 2 (two) times daily.     ondansetron 4 MG tablet  Commonly known as:  ZOFRAN  Take 1 tablet (4 mg total) by mouth every 6 (six)  hours as needed for nausea.     vancomycin 50 mg/mL oral solution  Commonly known as:  VANCOCIN  Take 5 mLs (250 mg total) by mouth every 6 (six) hours.        Signed: Willia Craze 08/09/2013, 11:36 AM

## 2013-08-12 ENCOUNTER — Telehealth: Payer: Self-pay | Admitting: *Deleted

## 2013-08-12 MED ORDER — SERTRALINE HCL 50 MG PO TABS: 50.0000 mg | ORAL_TABLET | Freq: Every day | ORAL | Status: DC

## 2013-08-12 NOTE — Telephone Encounter (Signed)
zoloft rx sent to pharamcy

## 2013-08-12 NOTE — Telephone Encounter (Signed)
Pt was admitted to hospice yesterday, they feel he is depressed and would like you to order some zoloft. Call [phone] [facility]

## 2013-08-13 ENCOUNTER — Other Ambulatory Visit: Payer: Self-pay | Admitting: *Deleted

## 2013-08-13 MED ORDER — SERTRALINE HCL 50 MG PO TABS: 50.0000 mg | ORAL_TABLET | Freq: Every day | ORAL | Status: AC

## 2013-08-20 ENCOUNTER — Ambulatory Visit: Payer: Medicare Other | Admitting: Gastroenterology

## 2013-08-24 DIAGNOSIS — R17 Unspecified jaundice: Secondary | ICD-10-CM

## 2013-08-24 DIAGNOSIS — K7460 Unspecified cirrhosis of liver: Secondary | ICD-10-CM

## 2013-08-24 DIAGNOSIS — R5381 Other malaise: Secondary | ICD-10-CM

## 2013-08-24 DIAGNOSIS — R5383 Other fatigue: Secondary | ICD-10-CM

## 2013-08-24 DIAGNOSIS — R52 Pain, unspecified: Secondary | ICD-10-CM

## 2013-08-24 DIAGNOSIS — R609 Edema, unspecified: Secondary | ICD-10-CM

## 2013-08-25 ENCOUNTER — Telehealth: Payer: Self-pay

## 2013-08-25 DIAGNOSIS — R188 Other ascites: Secondary | ICD-10-CM

## 2013-08-25 NOTE — Telephone Encounter (Signed)
Please make referral for this

## 2013-08-27 ENCOUNTER — Ambulatory Visit (HOSPITAL_COMMUNITY)
Admission: RE | Admit: 2013-08-27 | Discharge: 2013-08-27 | Disposition: A | Discharge status: Home / Self Care | Source: Ambulatory Visit | Attending: Family Medicine | Admitting: Family Medicine

## 2013-08-27 ENCOUNTER — Encounter (HOSPITAL_COMMUNITY): Payer: Self-pay

## 2013-08-27 DIAGNOSIS — R188 Other ascites: Secondary | ICD-10-CM | POA: Diagnosis present

## 2013-08-27 NOTE — Progress Notes (Signed)
Paracentesis complete no signs of distress. 8500 ml red colored ascites removed.

## 2013-09-15 ENCOUNTER — Other Ambulatory Visit: Payer: Self-pay

## 2013-09-15 DIAGNOSIS — R188 Other ascites: Secondary | ICD-10-CM

## 2013-09-16 ENCOUNTER — Encounter (HOSPITAL_COMMUNITY): Payer: Self-pay

## 2013-09-16 ENCOUNTER — Ambulatory Visit: Payer: Medicare Other | Admitting: Gastroenterology

## 2013-09-16 ENCOUNTER — Ambulatory Visit (HOSPITAL_COMMUNITY)
Admission: RE | Admit: 2013-09-16 | Discharge: 2013-09-16 | Disposition: A | Discharge status: Home / Self Care | Source: Ambulatory Visit | Attending: Family Medicine | Admitting: Family Medicine

## 2013-09-16 VITALS — BP 110/69 | HR 92 | Temp 97.0°F | Resp 16

## 2013-09-16 DIAGNOSIS — R188 Other ascites: Secondary | ICD-10-CM | POA: Insufficient documentation

## 2013-09-16 DIAGNOSIS — K769 Liver disease, unspecified: Secondary | ICD-10-CM | POA: Diagnosis not present

## 2013-09-16 NOTE — Progress Notes (Signed)
Paracentesis complete no signs of distress. 8800 ml amber colored abdominal fluid removed.

## 2013-10-10 ENCOUNTER — Telehealth: Payer: Self-pay | Admitting: *Deleted

## 2013-10-10 MED ORDER — FENTANYL 25 MCG/HR TD PT72: 25.0000 ug | MEDICATED_PATCH | TRANSDERMAL | Status: AC

## 2013-10-10 NOTE — Telephone Encounter (Signed)
Beth at [facility] would like an Rx for pt, Duragesic 25 mcg patch q 3 days, she says he will not live long, only give #5 patches. This must be faxed to [place], write Hospice pt. On Rx call beth when done

## 2013-10-13 ENCOUNTER — Telehealth: Payer: Self-pay | Admitting: *Deleted

## 2013-10-25 NOTE — Telephone Encounter (Signed)
[facility] called and said that patient passed away today

## 2013-10-25 NOTE — Telephone Encounter (Signed)
ok 

## 2013-10-25 DEATH — deceased

## 2014-04-09 ENCOUNTER — Encounter (HOSPITAL_COMMUNITY): Payer: Self-pay | Admitting: Interventional Radiology

## 2015-07-15 IMAGING — CT CT ABDOMEN W/O CM
2 of 4 series · 16 of 46 positions shown, 18 images · non-contrast
Comparison: Report from 12/07/2003 MR angiogram.

CLINICAL DATA: Mid abdominal pain.  Hepatomegaly.

EXAM:
CT ABDOMEN WITHOUT CONTRAST
TECHNIQUE: Multidetector CT imaging of the abdomen was performed following the
standard protocol without IV contrast.

[Series 2: abdomen/pelvis w/o contrast · axial · non-contrast · 0.72mm/px · z∈[-262,-18]mm · 13 of 57 slices shown, 15 images]
[im 4/57  soft-tissue]
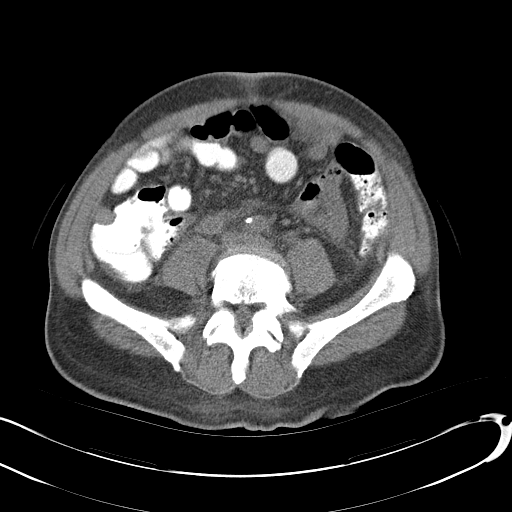
[im 4/57  bone]
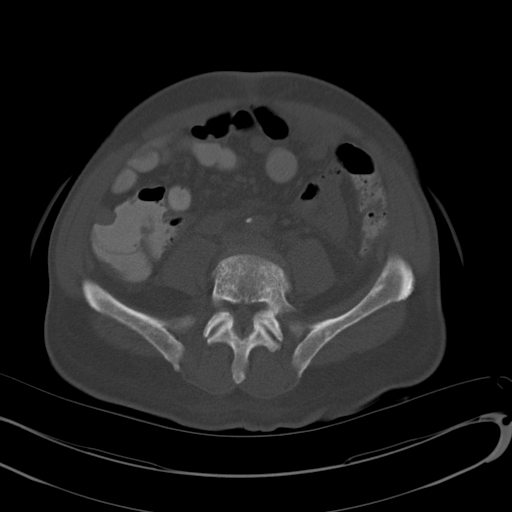
[im 8/57  soft-tissue]
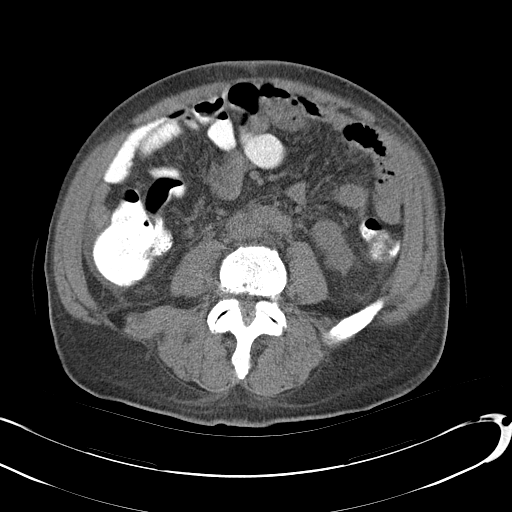
[im 12/57  soft-tissue]
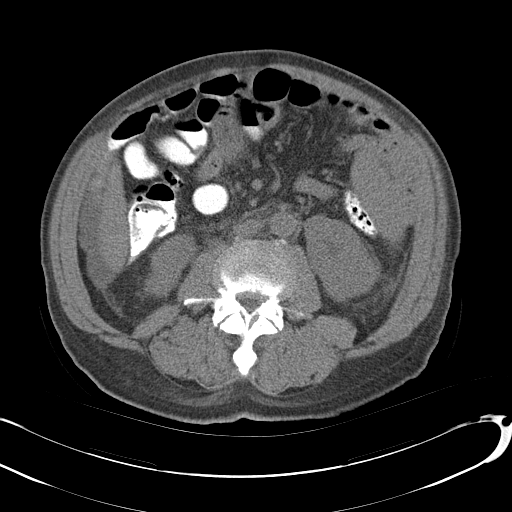
[im 15/57  soft-tissue]
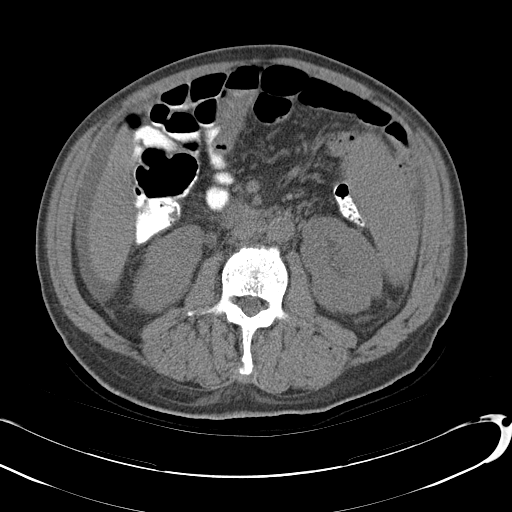
[im 19/57  soft-tissue]
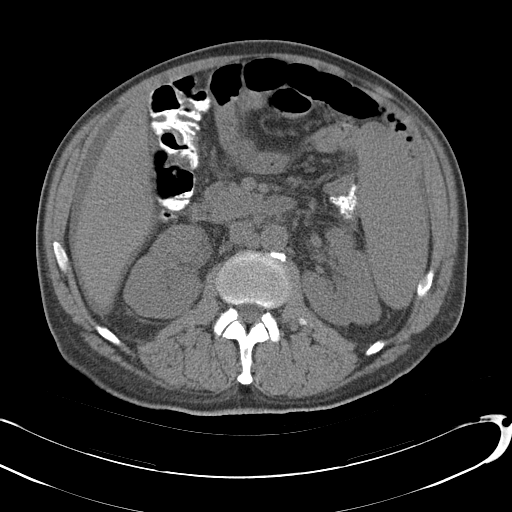
[im 23/57  soft-tissue]
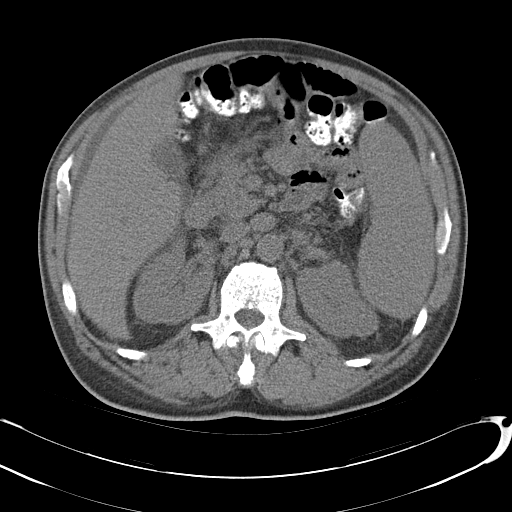
[im 30/57  soft-tissue]
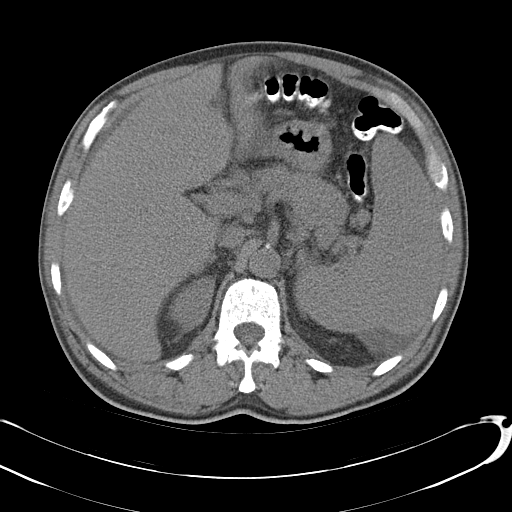
[im 34/57  soft-tissue]
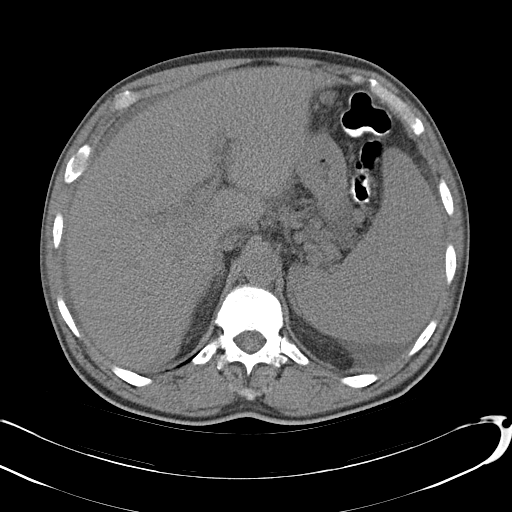
[im 38/57  soft-tissue]
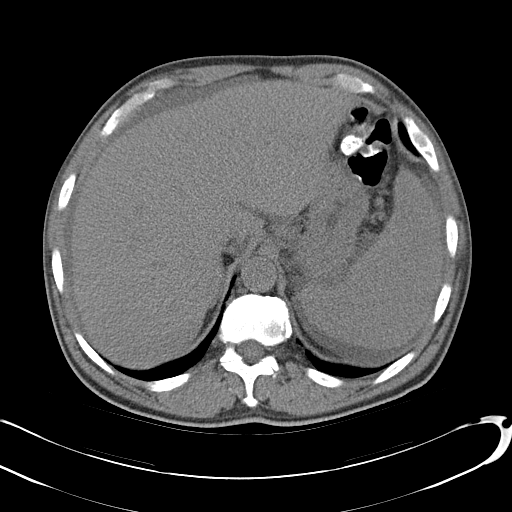
[im 38/57  bone]
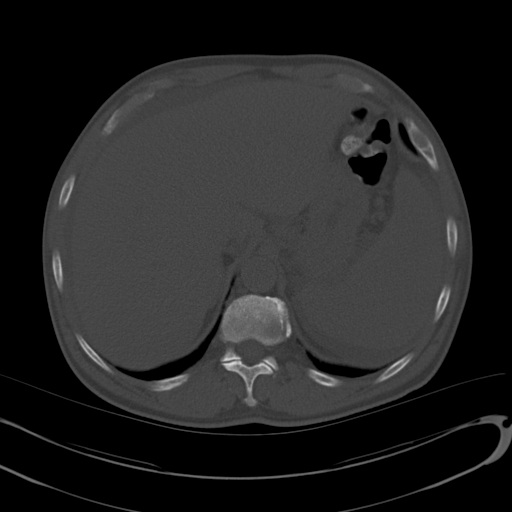
[im 42/57  soft-tissue]
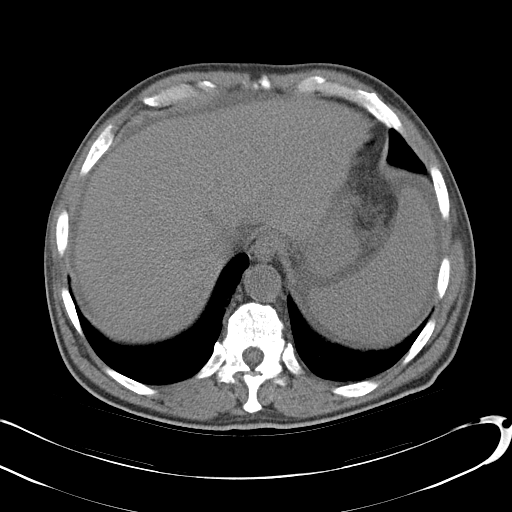
[im 45/57  soft-tissue]
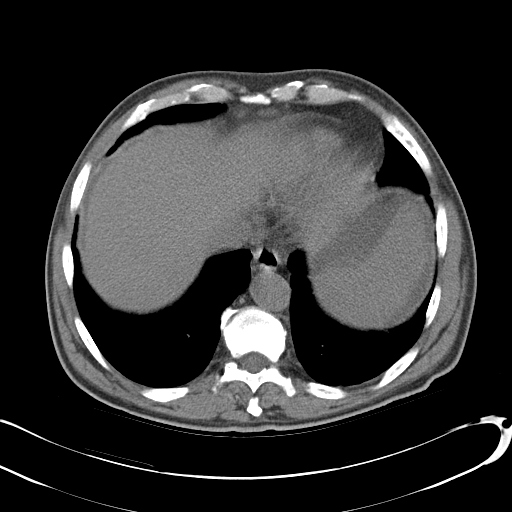
[im 49/57  soft-tissue]
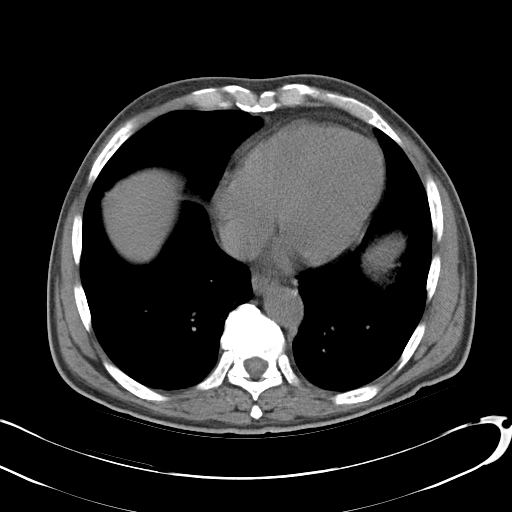
[im 53/57  soft-tissue]
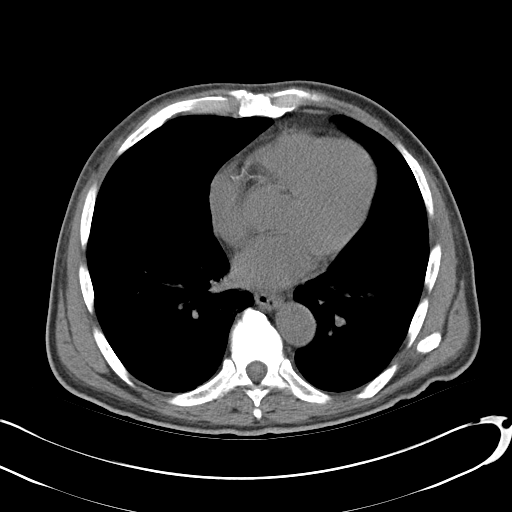

[Series 4: mpr cor (id) · coronal · 0.57mm/px · 3 of 91 slices shown]
[im 31/91  soft-tissue]
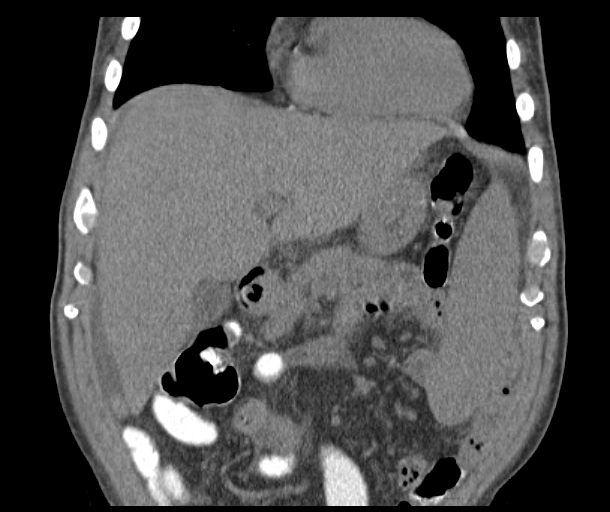
[im 41/91  soft-tissue]
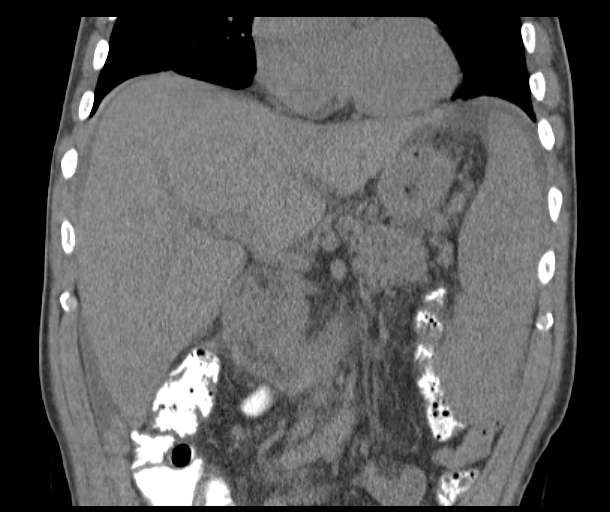
[im 51/91  soft-tissue]
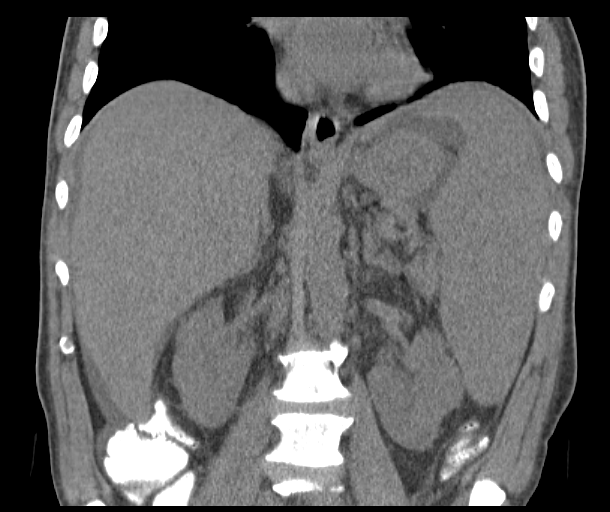

[16 of 46 positions shown; findings below may reference images not displayed]

FINDINGS: Splenomegaly noted with splenic volume 970 cc. Epicardial lymph node
short axis 1.0 cm.

Upper abdominal ascites noted. Mild hepatic megaly with liver
measuring 20.3 cm craniocaudad.

Nodularity in the splenic hilum comment gastrohepatic ligament, and
retroperitoneum is at least partially due to adenopathy, with a
cm aortic caval lymph node shown on image 35 of series 2. Some of
this nodularity may also be due to [place] systemic shunting/varices.
The gallbladder appears contracted and thick wall.

Renal contour is unremarkable. Hepatic contour unremarkable. No
adrenal mass identified. No definite pancreatic contour abnormality.

Lower lumbar spondylosis and degenerative disc disease noted.
Heterogeneous with appearance of the marrow in the ribs and spine.
IMPRESSION: 1. Splenomegaly and mild hepatomegaly.
2. Pathologic retroperitoneal adenopathy with suspected adenopathy
in the root of the mesenteric and potentially along the
gastrohepatic ligament. Malignancy not excluded.
3. Mild ascites with mesenteric edema.
4. Suspected portosystemic varices, concerning for portal
hypertension.
5. Heterogeneous marrow. Diffuse marrow infiltrative process not
excluded.
6. Lower lumbar spondylosis and degenerative disc disease.

## 2015-09-09 IMAGING — CR DG ABDOMEN 2V
3 series · 3 of 3 positions shown · non-contrast
Comparison: Ultrasound, 07/15/2013.  CT, 05/29/2013.

CLINICAL DATA: Cirrhosis.  Distended abdomen.

EXAM:
ABDOMEN - 2 VIEW

[w abdomen upright *]
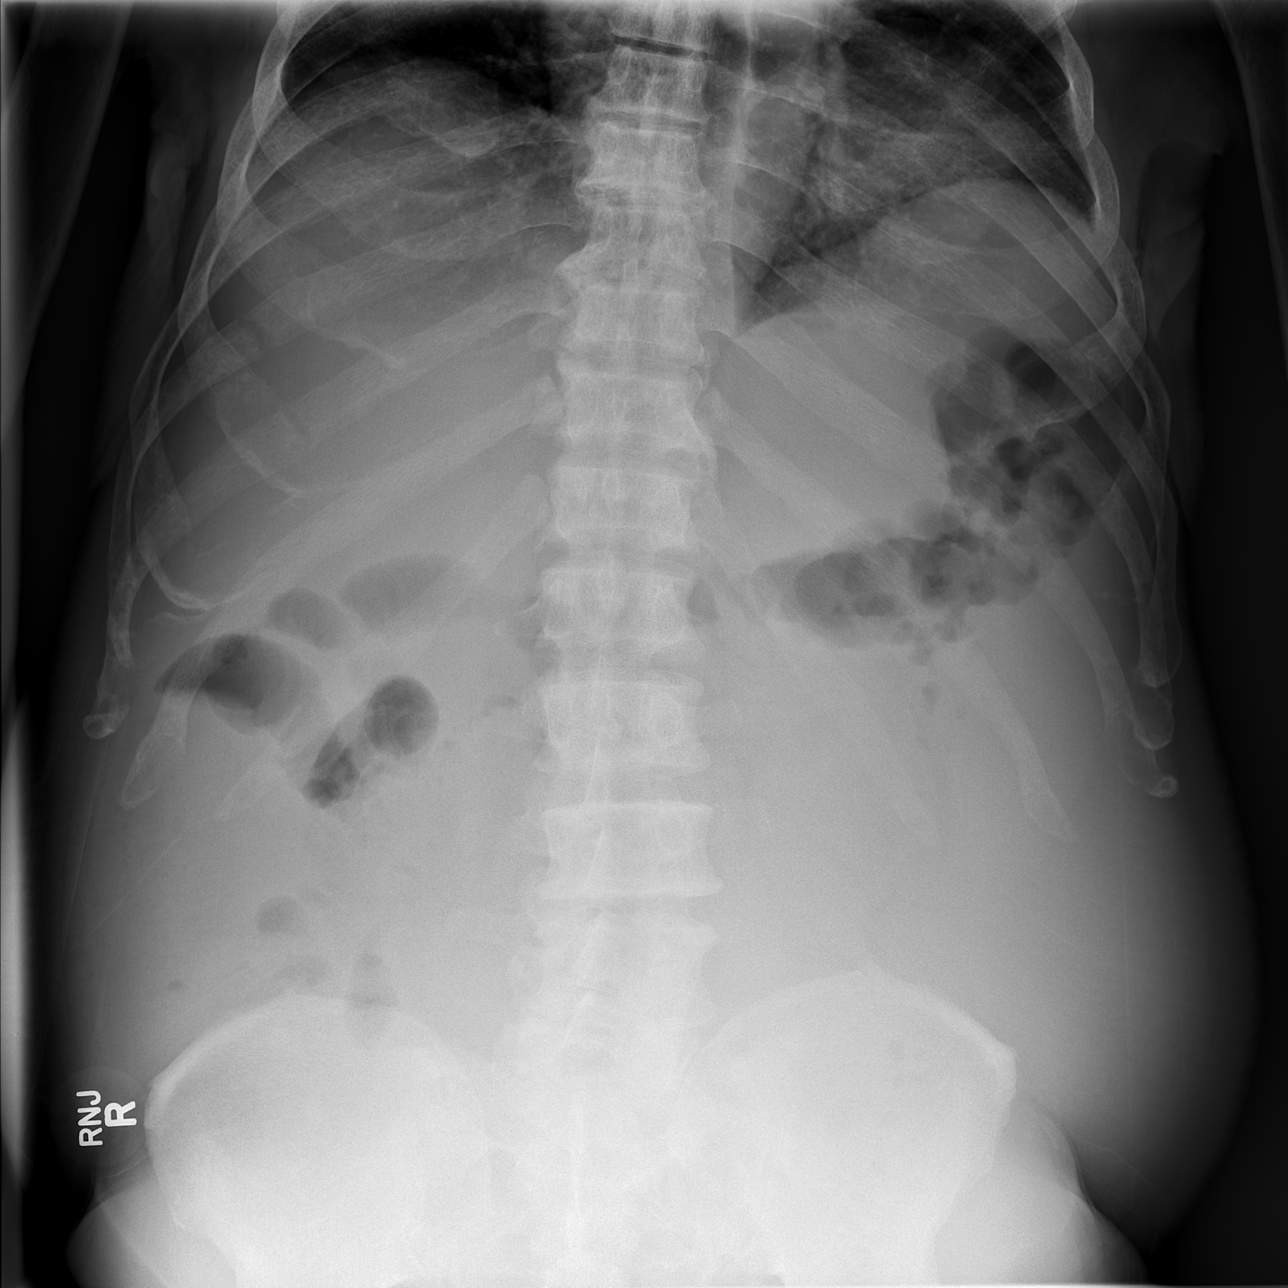

[t abdomen supine]
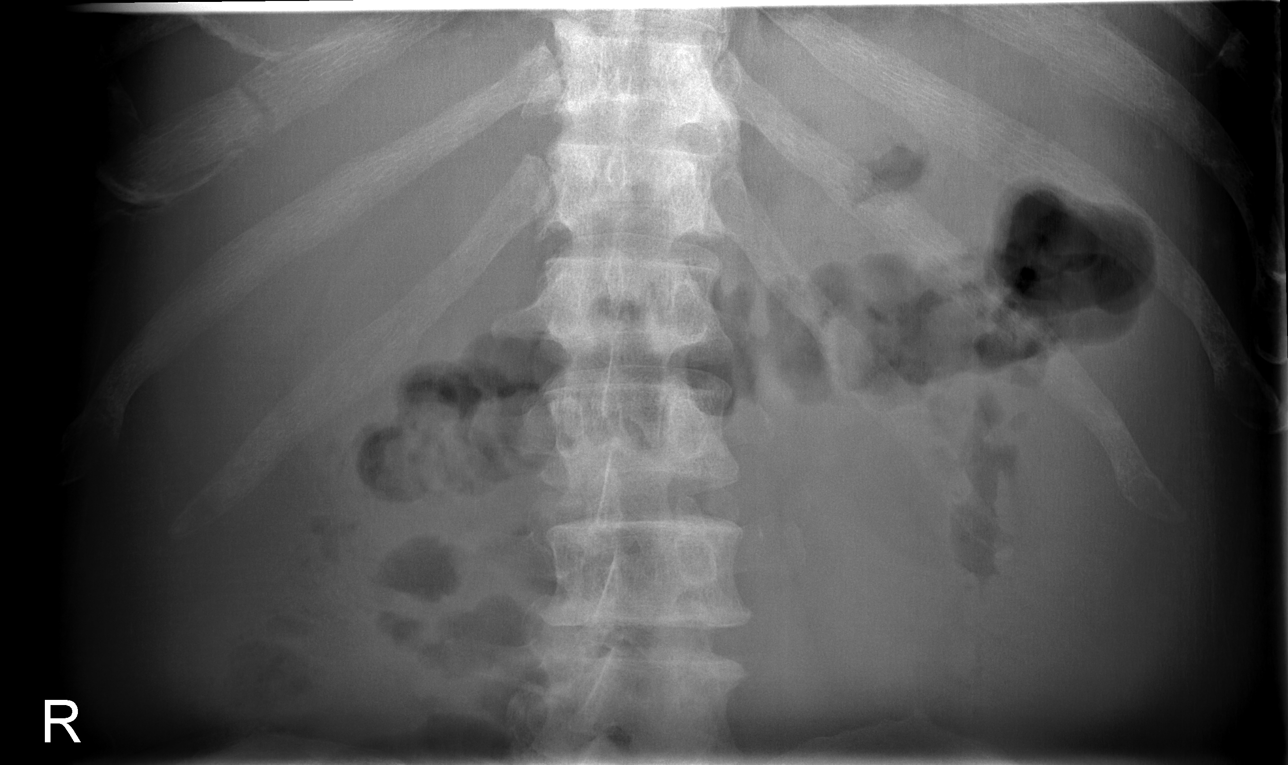

[t abdomen supine *]
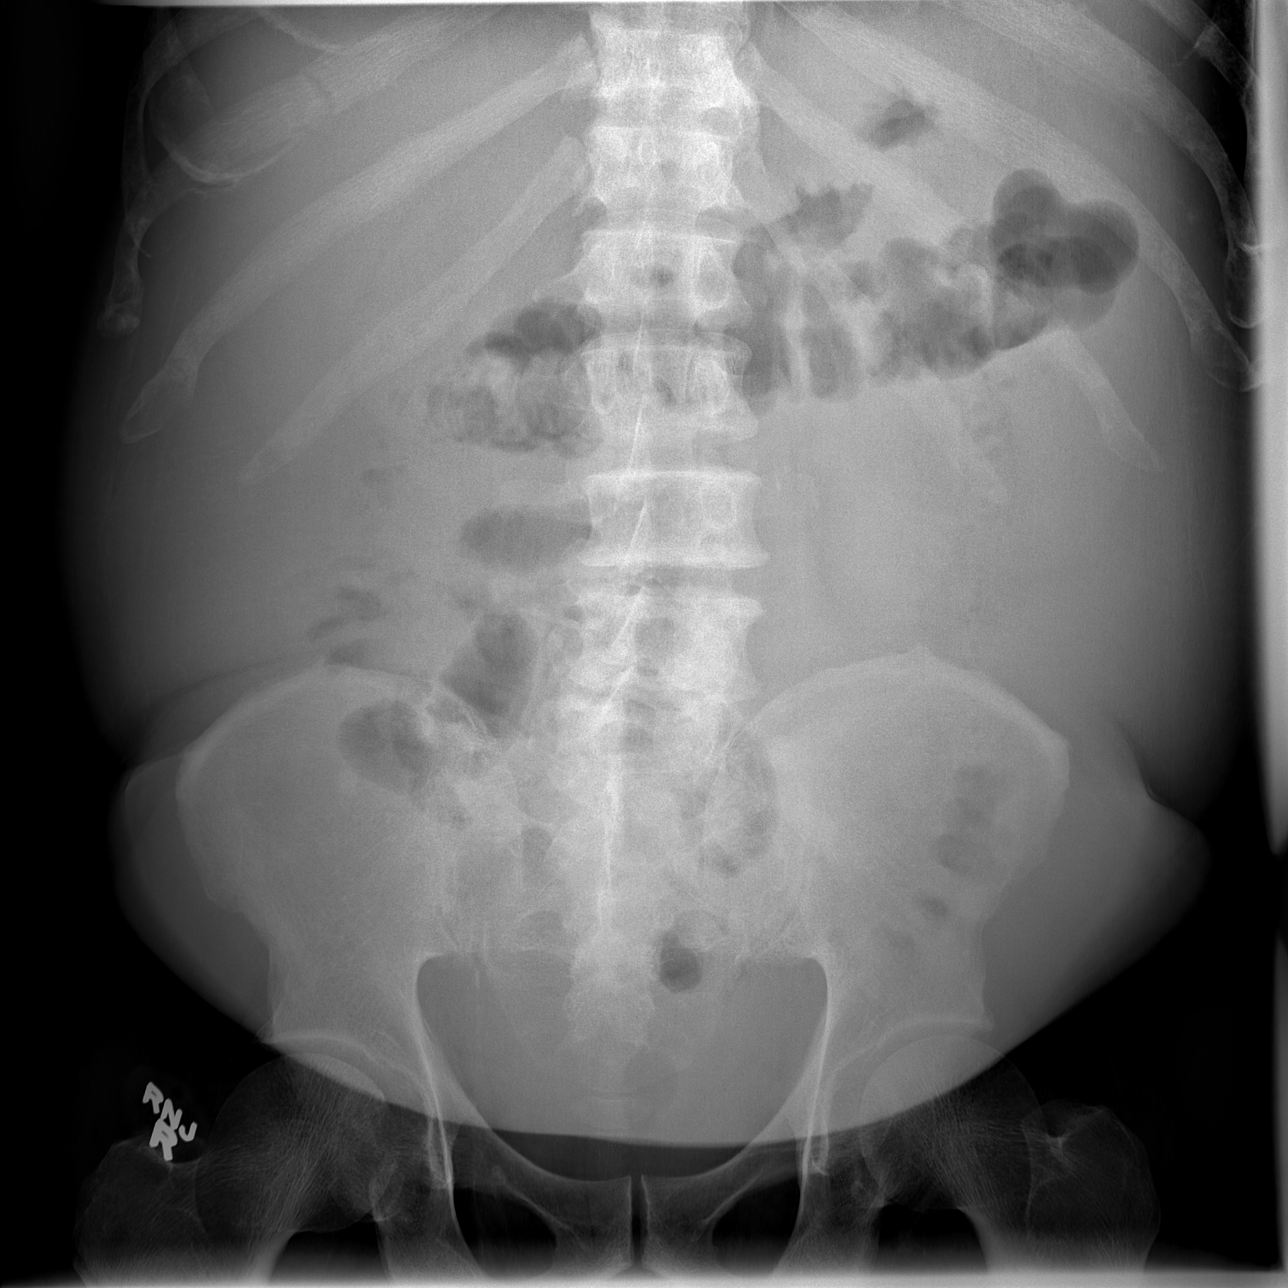

[3 of 3 positions shown; findings below may reference images not displayed]

FINDINGS: Normal bowel gas pattern. No evidence of obstruction or generalized
adynamic ileus.

No free air.

Soft tissue planes are somewhat indistinct, which is likely from the
known ascites.

Disk degenerative changes of the lower lumbar spine. No osteoblastic
or osteolytic lesions.
IMPRESSION: No acute findings. No obstruction, free air or generalized adynamic
ileus.

## 2015-09-09 IMAGING — US US PARACENTESIS
1 series · 6 of 6 positions shown · non-contrast
Comparison: Previous paracentesis.

CLINICAL DATA: Recurrent ascites

EXAM:
ULTRASOUND GUIDED PARACENTESIS

[Series 1: us paracentesis · 0.27mm/px · 6 of 6 slices shown]
[im 1/6]
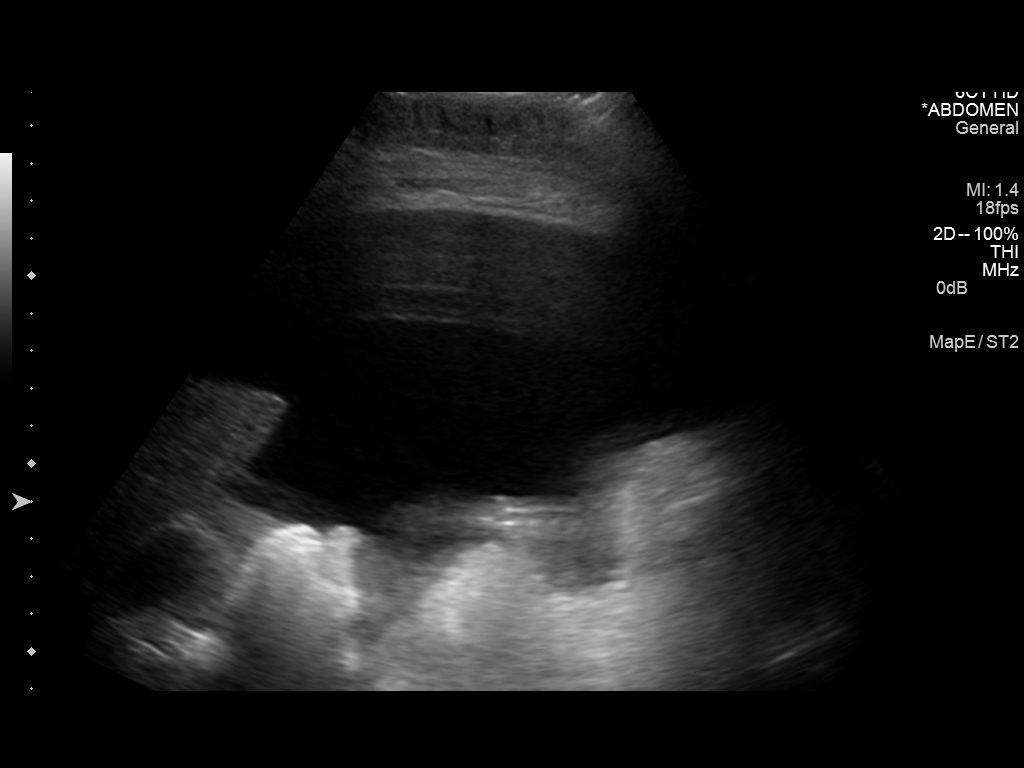
[im 2/6]
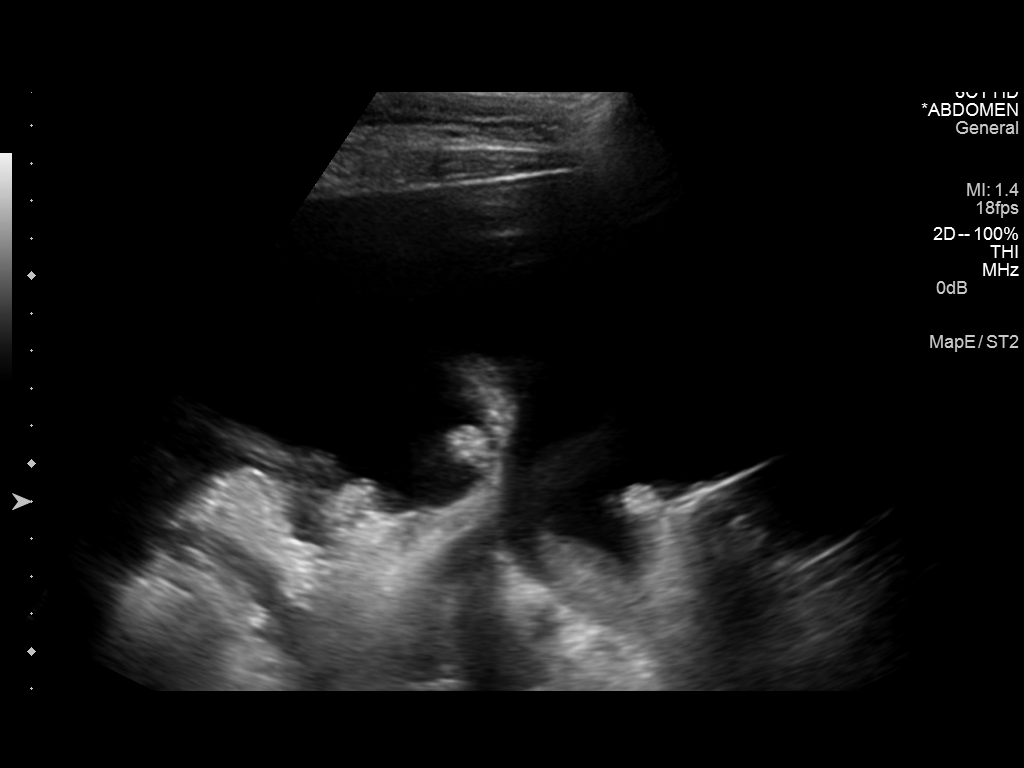
[im 3/6]
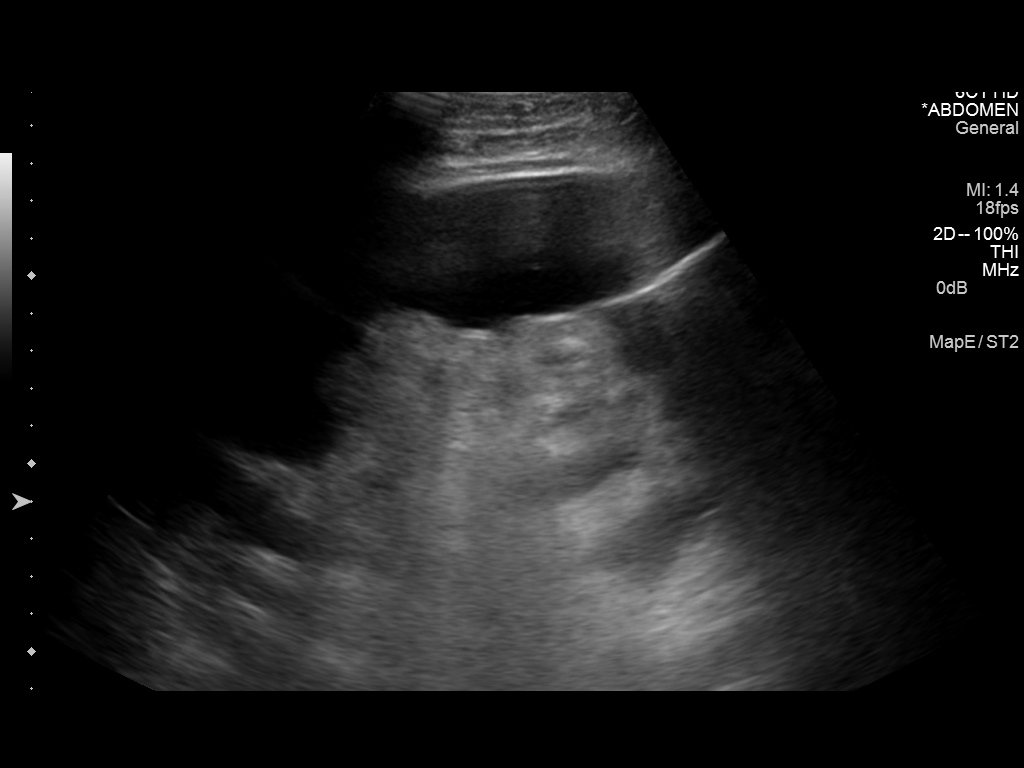
[im 4/6]
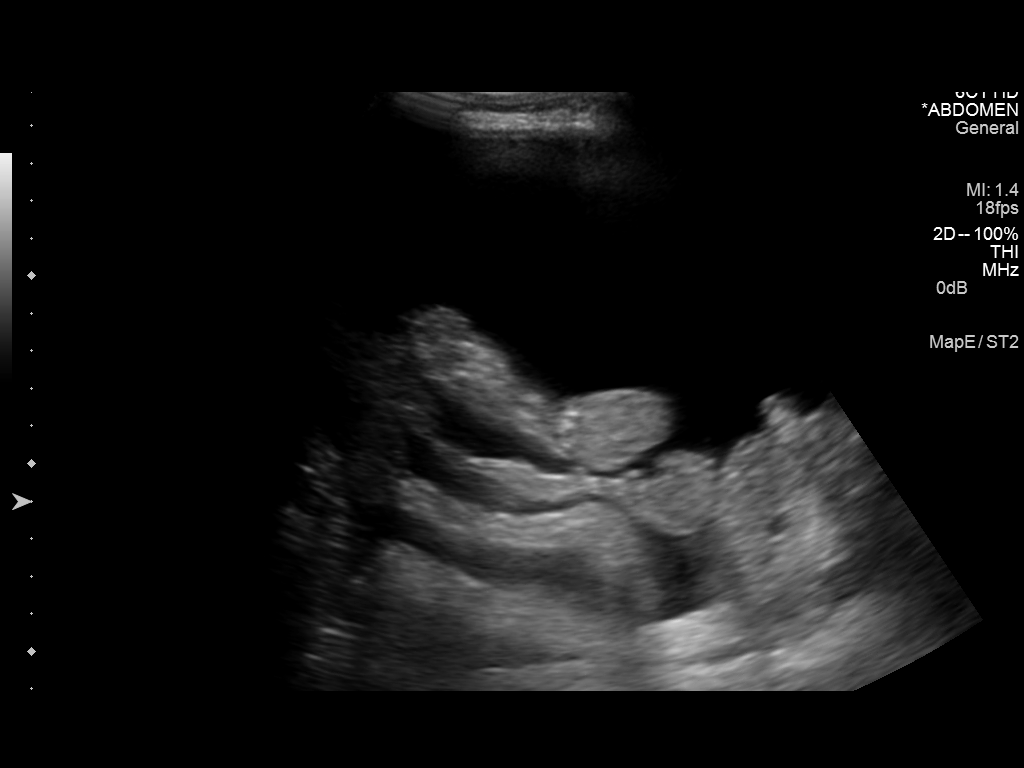
[im 5/6]
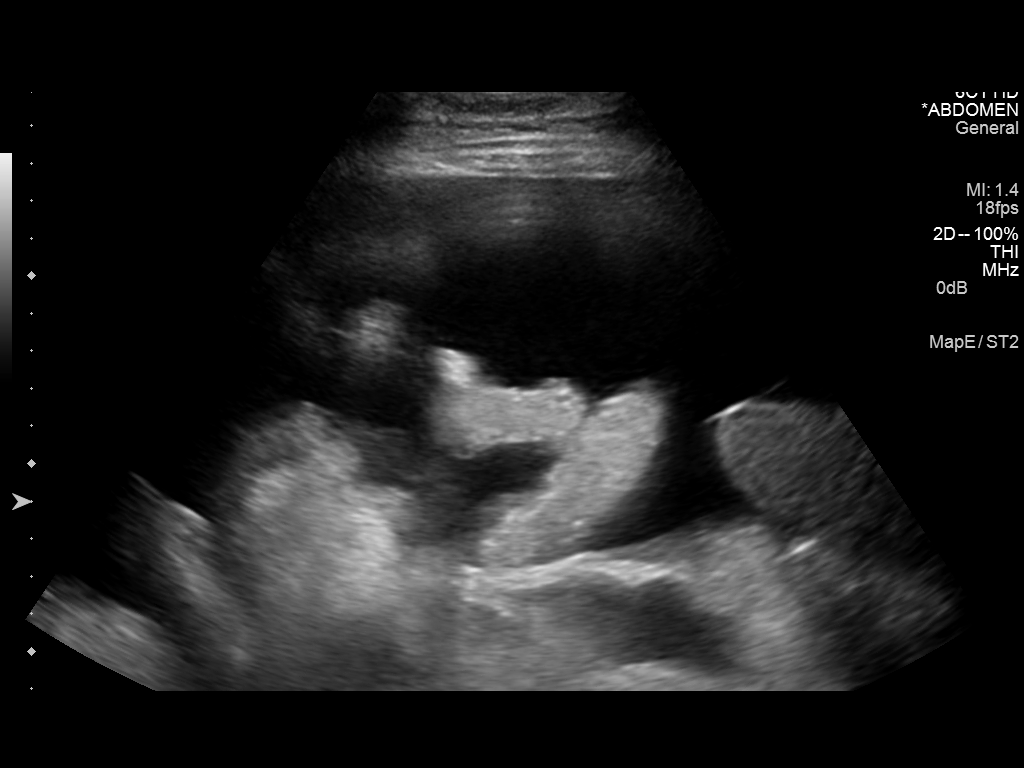
[im 6/6]
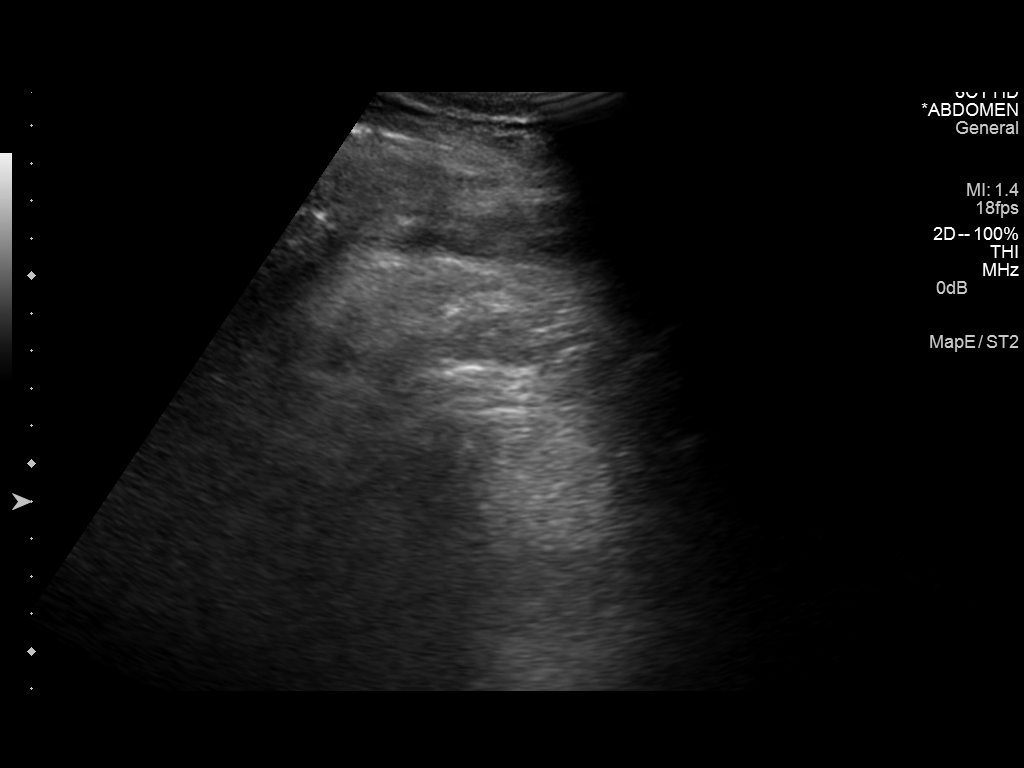

[6 of 6 positions shown; findings below may reference images not displayed]

PROCEDURE:
An ultrasound guided paracentesis was thoroughly discussed with the
patient and questions answered. The benefits, risks, alternatives
and complications were also discussed. The patient understands and
wishes to proceed with the procedure. Written consent was obtained.

Ultrasound was performed to localize and mark an adequate pocket of
fluid in the.left lower.. quadrant of the abdomen. The area was then
prepped and draped in the normal sterile fashion. 1% Lidocaine was
used for local anesthesia. Under ultrasound guidance a 19 gauge Yueh
catheter was introduced. Paracentesis was performed. The catheter
was removed and a dressing applied.

COMPLICATIONS:
None immediate
FINDINGS: A total of approximately.6.5 liters.. of..clear yellow. fluid was
removed. A fluid sample was not sent for laboratory analysis.
IMPRESSION: Successful ultrasound guided paracentesis yielding.6.5 liters.. of
ascites.

Read by Marquel Burgin
